# Patient Record
Sex: Male | Born: 1935 | Race: White | Hispanic: No | Marital: Single | Smoking: Former smoker
Health system: Southern US, Community
[De-identification: ages and names within clinical notes are randomized; demographics above are authoritative.]

## PROBLEM LIST (undated history)

## (undated) DIAGNOSIS — F068 Other specified mental disorders due to known physiological condition: Secondary | ICD-10-CM

## (undated) DIAGNOSIS — I2510 Atherosclerotic heart disease of native coronary artery without angina pectoris: Secondary | ICD-10-CM

## (undated) DIAGNOSIS — I447 Left bundle-branch block, unspecified: Secondary | ICD-10-CM

## (undated) DIAGNOSIS — I714 Abdominal aortic aneurysm, without rupture, unspecified: Secondary | ICD-10-CM

## (undated) DIAGNOSIS — Z9049 Acquired absence of other specified parts of digestive tract: Secondary | ICD-10-CM

## (undated) DIAGNOSIS — I509 Heart failure, unspecified: Secondary | ICD-10-CM

## (undated) DIAGNOSIS — I6200 Nontraumatic subdural hemorrhage, unspecified: Secondary | ICD-10-CM

## (undated) DIAGNOSIS — N400 Enlarged prostate without lower urinary tract symptoms: Secondary | ICD-10-CM

## (undated) DIAGNOSIS — I499 Cardiac arrhythmia, unspecified: Secondary | ICD-10-CM

## (undated) DIAGNOSIS — N189 Chronic kidney disease, unspecified: Secondary | ICD-10-CM

## (undated) DIAGNOSIS — R55 Syncope and collapse: Secondary | ICD-10-CM

## (undated) DIAGNOSIS — I639 Cerebral infarction, unspecified: Secondary | ICD-10-CM

## (undated) DIAGNOSIS — R413 Other amnesia: Secondary | ICD-10-CM

## (undated) DIAGNOSIS — I701 Atherosclerosis of renal artery: Secondary | ICD-10-CM

## (undated) DIAGNOSIS — F329 Major depressive disorder, single episode, unspecified: Secondary | ICD-10-CM

## (undated) DIAGNOSIS — F32A Depression, unspecified: Secondary | ICD-10-CM

## (undated) DIAGNOSIS — I10 Essential (primary) hypertension: Secondary | ICD-10-CM

## (undated) DIAGNOSIS — I6529 Occlusion and stenosis of unspecified carotid artery: Secondary | ICD-10-CM

## (undated) DIAGNOSIS — E785 Hyperlipidemia, unspecified: Secondary | ICD-10-CM

## (undated) HISTORY — DX: Chronic kidney disease, unspecified: N18.9

## (undated) HISTORY — DX: Other amnesia: R41.3

## (undated) HISTORY — DX: Cerebral infarction, unspecified: I63.9

## (undated) HISTORY — DX: Nontraumatic subdural hemorrhage, unspecified: I62.00

## (undated) HISTORY — DX: Acquired absence of other specified parts of digestive tract: Z90.49

## (undated) HISTORY — DX: Benign prostatic hyperplasia without lower urinary tract symptoms: N40.0

## (undated) HISTORY — PX: MASTOIDECTOMY: SHX711

## (undated) HISTORY — PX: INNER EAR SURGERY: SHX679

## (undated) HISTORY — DX: Major depressive disorder, single episode, unspecified: F32.9

## (undated) HISTORY — DX: Depression, unspecified: F32.A

## (undated) HISTORY — PX: CAROTID ENDARTERECTOMY: SUR193

## (undated) HISTORY — DX: Occlusion and stenosis of unspecified carotid artery: I65.29

## (undated) HISTORY — DX: Atherosclerosis of renal artery: I70.1

## (undated) HISTORY — PX: CARDIAC CATHETERIZATION: SHX172

## (undated) HISTORY — DX: Left bundle-branch block, unspecified: I44.7

## (undated) HISTORY — DX: Heart failure, unspecified: I50.9

## (undated) HISTORY — DX: Syncope and collapse: R55

## (undated) HISTORY — DX: Abdominal aortic aneurysm, without rupture, unspecified: I71.40

## (undated) HISTORY — DX: Other specified mental disorders due to known physiological condition: F06.8

## (undated) HISTORY — DX: Essential (primary) hypertension: I10

## (undated) HISTORY — DX: Atherosclerotic heart disease of native coronary artery without angina pectoris: I25.10

## (undated) HISTORY — DX: Hyperlipidemia, unspecified: E78.5

## (undated) HISTORY — DX: Cardiac arrhythmia, unspecified: I49.9

## (undated) HISTORY — DX: Abdominal aortic aneurysm, without rupture: I71.4

---

## 1998-06-14 ENCOUNTER — Other Ambulatory Visit: Admission: RE | Admit: 1998-06-14 | Discharge: 1998-06-14 | Payer: Self-pay | Admitting: Cardiology

## 1999-04-27 ENCOUNTER — Encounter: Payer: Self-pay | Admitting: Cardiology

## 1999-04-27 ENCOUNTER — Ambulatory Visit (HOSPITAL_COMMUNITY): Admission: RE | Admit: 1999-04-27 | Discharge: 1999-04-27 | Payer: Self-pay | Admitting: Cardiology

## 2000-06-05 ENCOUNTER — Ambulatory Visit (HOSPITAL_COMMUNITY): Admission: RE | Admit: 2000-06-05 | Discharge: 2000-06-05 | Payer: Self-pay | Admitting: Vascular Surgery

## 2000-06-05 ENCOUNTER — Encounter: Payer: Self-pay | Admitting: Vascular Surgery

## 2000-09-18 ENCOUNTER — Encounter: Payer: Self-pay | Admitting: Vascular Surgery

## 2000-09-20 ENCOUNTER — Inpatient Hospital Stay: Admission: RE | Admit: 2000-09-20 | Discharge: 2000-09-21 | Payer: Self-pay | Admitting: Vascular Surgery

## 2000-09-20 ENCOUNTER — Encounter (INDEPENDENT_AMBULATORY_CARE_PROVIDER_SITE_OTHER): Payer: Self-pay | Admitting: Specialist

## 2000-10-09 ENCOUNTER — Encounter: Payer: Self-pay | Admitting: Vascular Surgery

## 2000-10-09 ENCOUNTER — Encounter: Admission: RE | Admit: 2000-10-09 | Discharge: 2000-10-09 | Payer: Self-pay | Admitting: Vascular Surgery

## 2001-06-22 ENCOUNTER — Emergency Department (HOSPITAL_COMMUNITY): Admission: EM | Admit: 2001-06-22 | Discharge: 2001-06-22 | Payer: Self-pay | Admitting: Emergency Medicine

## 2001-06-22 ENCOUNTER — Encounter: Payer: Self-pay | Admitting: Cardiology

## 2004-04-26 ENCOUNTER — Emergency Department (HOSPITAL_COMMUNITY): Admission: EM | Admit: 2004-04-26 | Discharge: 2004-04-26 | Payer: Self-pay | Admitting: Hematology and Oncology

## 2005-06-05 ENCOUNTER — Encounter: Admission: RE | Admit: 2005-06-05 | Discharge: 2005-06-05 | Payer: Self-pay | Admitting: Cardiology

## 2005-08-16 ENCOUNTER — Inpatient Hospital Stay (HOSPITAL_COMMUNITY): Admission: RE | Admit: 2005-08-16 | Discharge: 2005-08-18 | Payer: Self-pay | Admitting: Vascular Surgery

## 2005-09-22 ENCOUNTER — Other Ambulatory Visit: Payer: Self-pay

## 2005-09-22 ENCOUNTER — Inpatient Hospital Stay: Payer: Self-pay | Admitting: Internal Medicine

## 2005-10-23 ENCOUNTER — Ambulatory Visit: Payer: Self-pay | Admitting: Gastroenterology

## 2006-01-22 ENCOUNTER — Ambulatory Visit: Payer: Self-pay | Admitting: Internal Medicine

## 2006-01-25 ENCOUNTER — Ambulatory Visit: Payer: Self-pay | Admitting: Internal Medicine

## 2006-04-09 ENCOUNTER — Ambulatory Visit: Payer: Self-pay | Admitting: Internal Medicine

## 2006-04-24 ENCOUNTER — Ambulatory Visit: Payer: Self-pay | Admitting: Internal Medicine

## 2007-04-24 ENCOUNTER — Inpatient Hospital Stay: Payer: Self-pay | Admitting: General Surgery

## 2007-05-06 ENCOUNTER — Ambulatory Visit: Payer: Self-pay | Admitting: General Surgery

## 2007-05-07 ENCOUNTER — Ambulatory Visit: Payer: Self-pay | Admitting: General Surgery

## 2007-05-20 ENCOUNTER — Ambulatory Visit: Payer: Self-pay | Admitting: Urology

## 2007-05-21 ENCOUNTER — Ambulatory Visit: Payer: Self-pay | Admitting: Gastroenterology

## 2007-07-05 ENCOUNTER — Ambulatory Visit: Payer: Self-pay | Admitting: Gastroenterology

## 2007-11-22 IMAGING — CT CT ABDOMEN AND PELVIS WITHOUT AND WITH CONTRAST
2 of 4 series · 12 of 32 positions shown, 17 images · non-contrast
Comparison: none

REASON FOR EXAM: Left Hydronephrosis
COMMENTS:

[Series 2: soft tissue · axial · 0.63mm/px · z∈[-647,-332]mm · 8 of 81 slices shown, 13 images]
[im 9/81  soft-tissue]
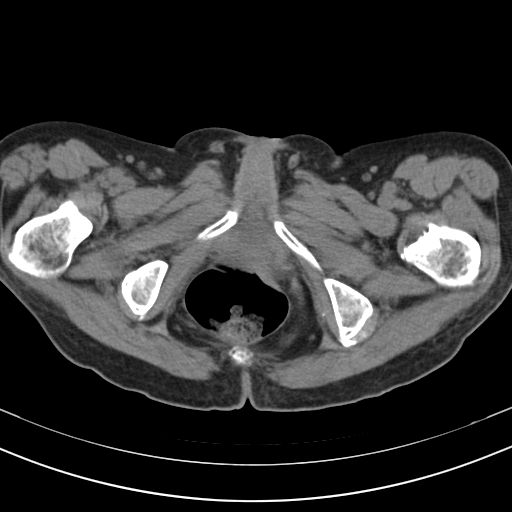
[im 9/81  bone]
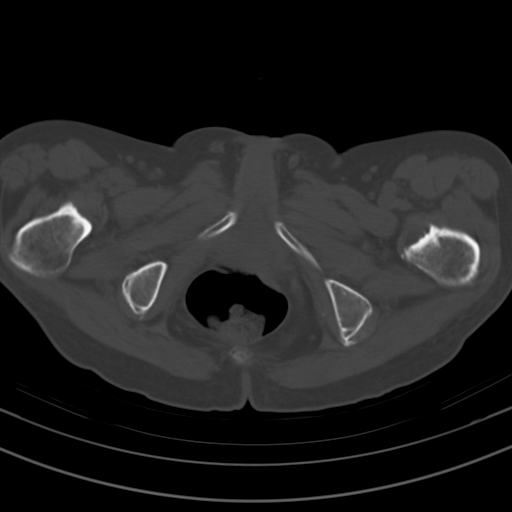
[im 18/81  soft-tissue]
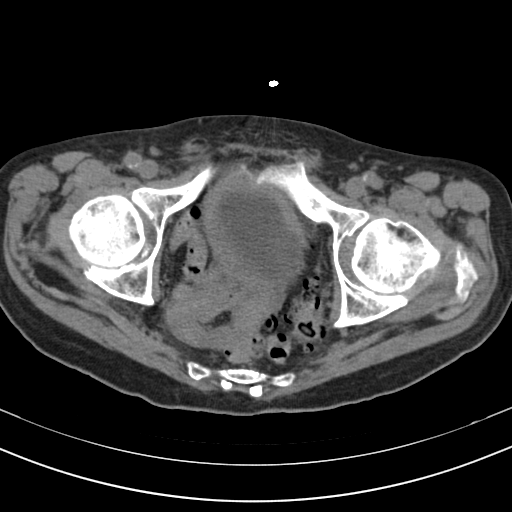
[im 27/81  soft-tissue]
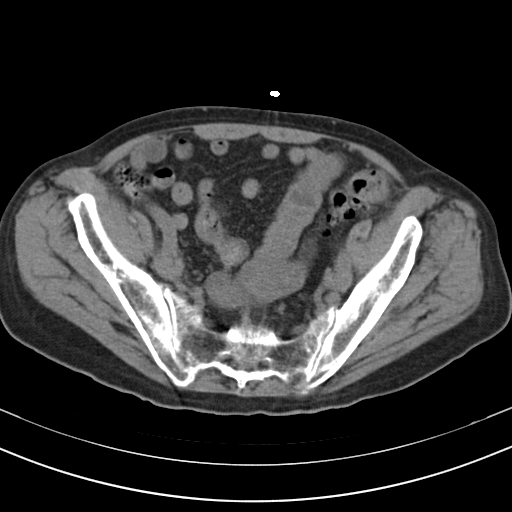
[im 36/81  soft-tissue]
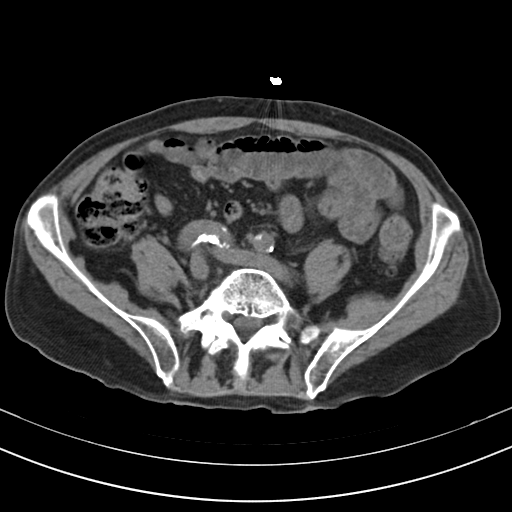
[im 45/81  soft-tissue]
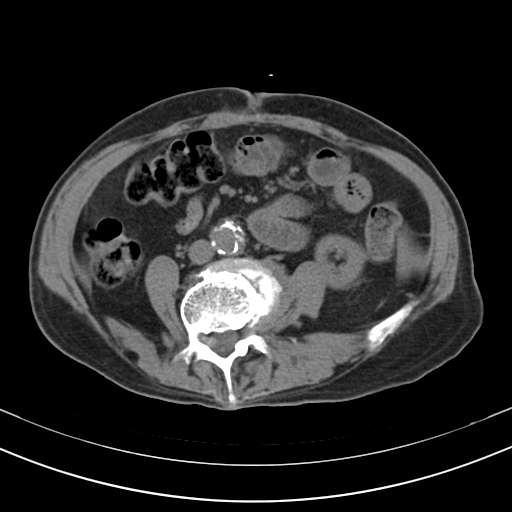
[im 45/81  lung]
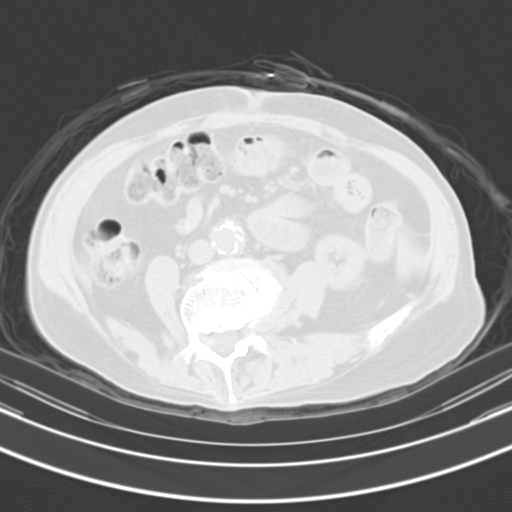
[im 54/81  soft-tissue]
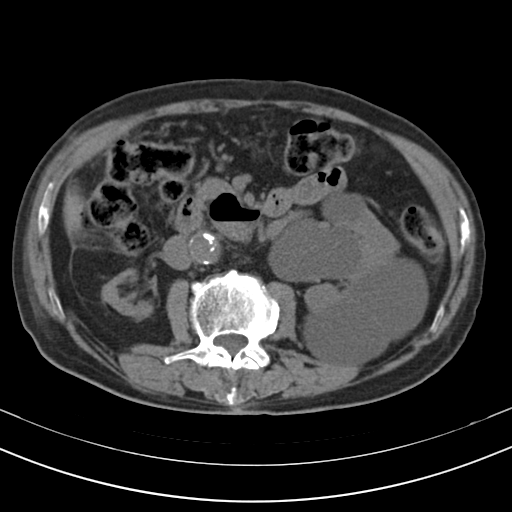
[im 54/81  lung]
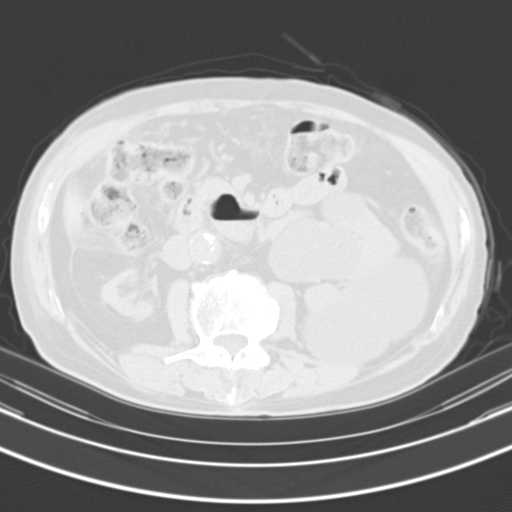
[im 63/81  soft-tissue]
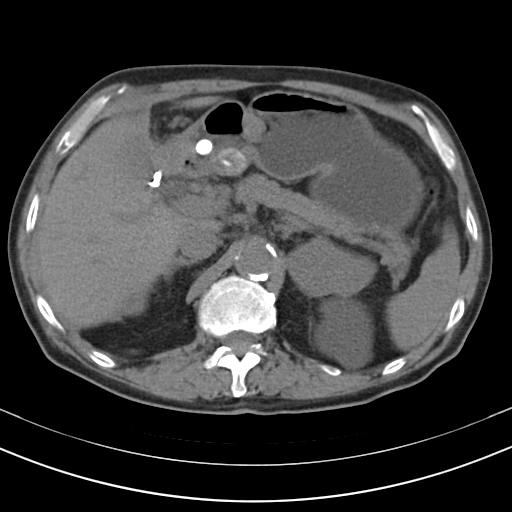
[im 63/81  lung]
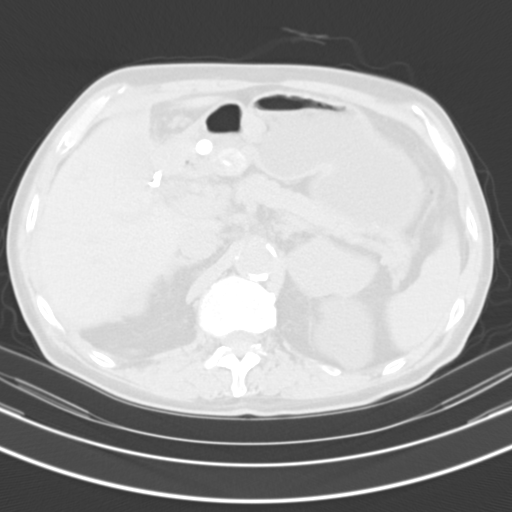
[im 72/81  soft-tissue]
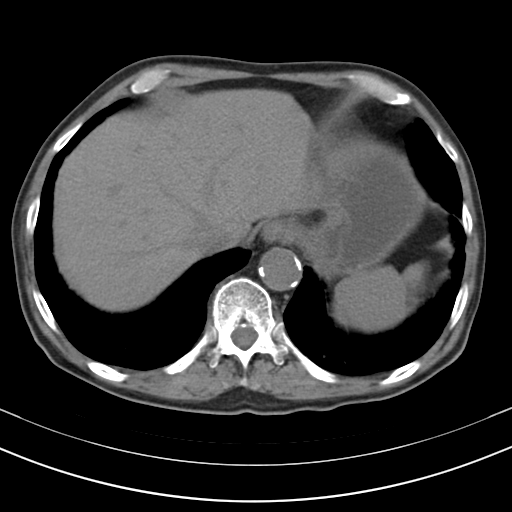
[im 72/81  lung]
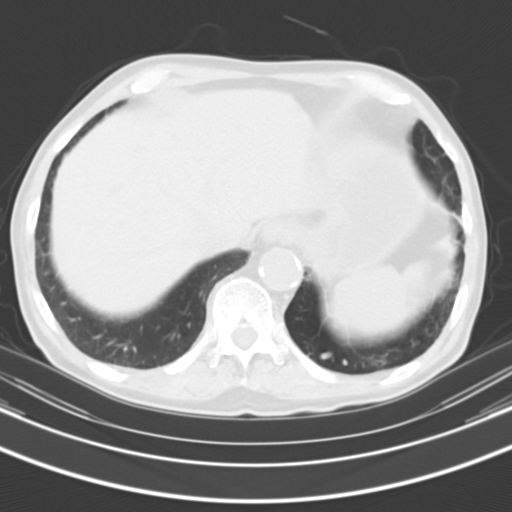

[Series 3: soft tissue with · axial · 0.63mm/px · z∈[-647,-512]mm · 4 of 81 slices shown]
[im 9/81  soft-tissue]
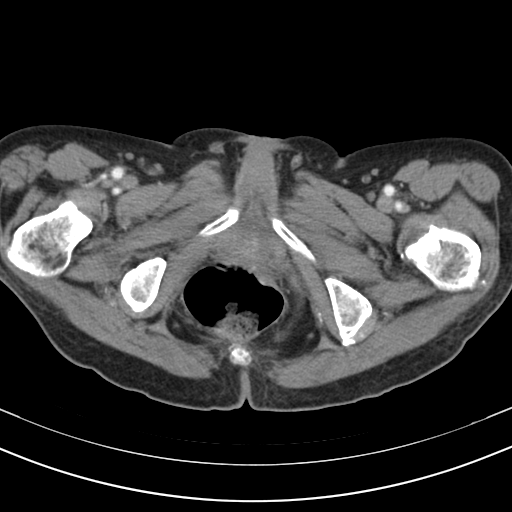
[im 18/81  soft-tissue]
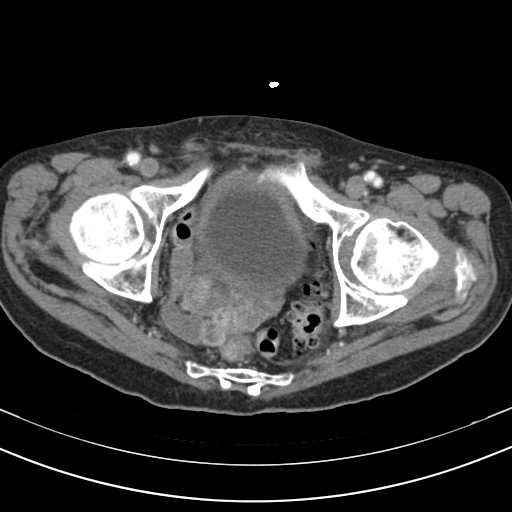
[im 27/81  soft-tissue]
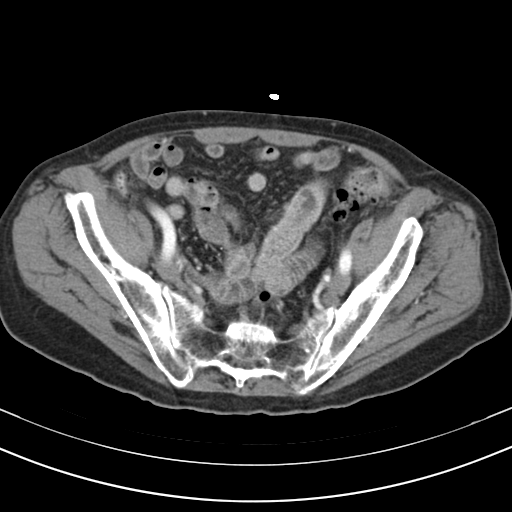
[im 36/81  soft-tissue]
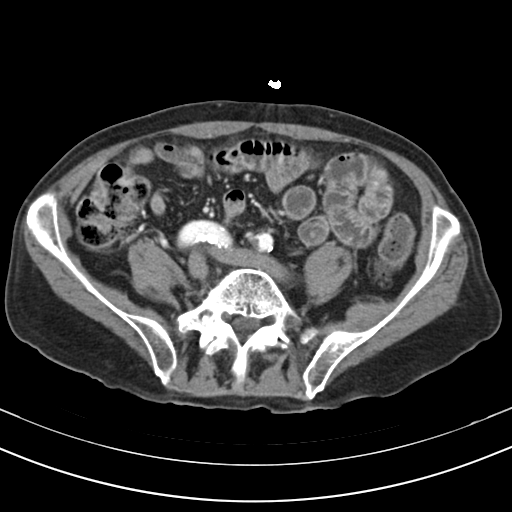

[12 of 32 positions shown; findings below may reference images not displayed]

PROCEDURE:     MEKIC - MEKIC ABDOMEN / PELVIS W/WO  - May 20, 2007 [DATE]

RESULT:     A triphasic scan was performed to allow evaluation of the
kidneys. On the noncontrast images, the RIGHT kidney is seen to be atrophic.
There is a hypodense focus in the midpole measuring approximately 1.0 cm in
diameter with Hounsfield measurements of 8 precontrast, 19 immediately post
contrast and approximately 6 on the delayed images. Only a tiny amount of
contrast is seen in the RIGHT kidney on the delayed images within the
collecting system.

On the LEFT there are dominant cortical and parapelvic cysts present. The
largest cyst appears to be either two closely applied cysts or a septated
cyst arising from the midpole. Hounsfield measurements are between -1 and +6
on all 3 phases of the study. The dominant parapelvic cyst has Hounsfield
measurements of approximately 8 on all 3 phases of the study. The parapelvic
cyst measures approximately  6.4 x 3.4 cm. The dominant midpole cyst
measures approximately 8.0 x 9.0 cm. A small anterior midpole cyst on the
LEFT measures approximately 2.6 x 2.8 cm. It has Hounsfield measurements
ranging from 3 precontrast, rising to 15 postcontrast, and falling to 7 on
delayed images. I do not see significant perinephric fluid collections. The
LEFT kidney enhances much more than that on the RIGHT.

The liver exhibits normal density. There is a trace of intrahepatic ductal
dilation. There are surgical clips in the gallbladder fossa. There is fluid
present in the gallbladder fossa measuring approximately 2.5 x 1.8 cm.
Increased density in the fat in the surrounding region is seen.

The stomach is distended with water. The pancreas, adrenal glands, and
spleen are normal in appearance. There is tortuosity the descending thoracic
aorta and there is curvature of the lumbar spine convex toward the RIGHT
with associated degenerative change. The partially distended urinary bladder
is normal in appearance. The partially distended loops of small and large
bowel exhibit no acute abnormality. There is a structure seen that likely
reflects a normal appendix containing some air.

Not mentioned above is an approximately 2.0 cm diameter subcapsular fluid
collection in the liver measuring approximately 19 Hounsfield units. It is
nonspecific in appearance. This may be reflective of the fluid collection
identified on prior ultrasound.

The lung bases exhibit some emphysematous changes and evidence of
bronchiectasis in the LEFT lower lobe.
IMPRESSION: 1. There is RIGHT renal atrophy. There are dominant parapelvic and cortical
cysts on the LEFT. I do not see urinary tract obstruction. There is
calcification of the wall of the abdominal aorta and there is some
calcification within the RIGHT renal artery proximally that may be the
etiology for the renal atrophy.
2. The patient has undergone prior cholecystectomy on the RIGHT. There is
ill-defined increased density in the RIGHT upper quadrant adjacent to the
region of the gallbladder fossa and a small amount adjacent to the inferior
aspect of the liver. Correlation with any symptoms in this regard is needed.
Was the patient's surgery recent?  Are there findings that might indicate
low level inflammation clinically?
3. There are degenerative changes of the lumbar spine with scoliosis convex
to the RIGHT. There is also evidence of bronchiectasis in the LEFT lower
lobe.
4. On delayed images, the appearance of the urinary bladder is within the
limits of normal. The LEFT ureter does not appear distended. There may be a
trace of free fluid in the pelvis but it is  difficult to separate loops of
unopacified small bowel from the adjacent soft tissues.

## 2007-11-23 IMAGING — RF DG UGI W/ KUB
1 series · 14 of 23 positions shown · non-contrast
Comparison: none

REASON FOR EXAM: epigastric pain
COMMENTS:

[Series 1: run · 14 of 23 slices shown]
[im 1/23]
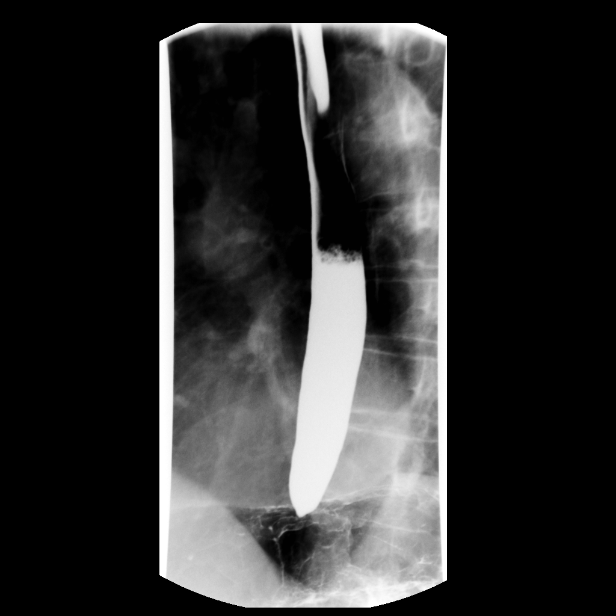
[im 3/23]
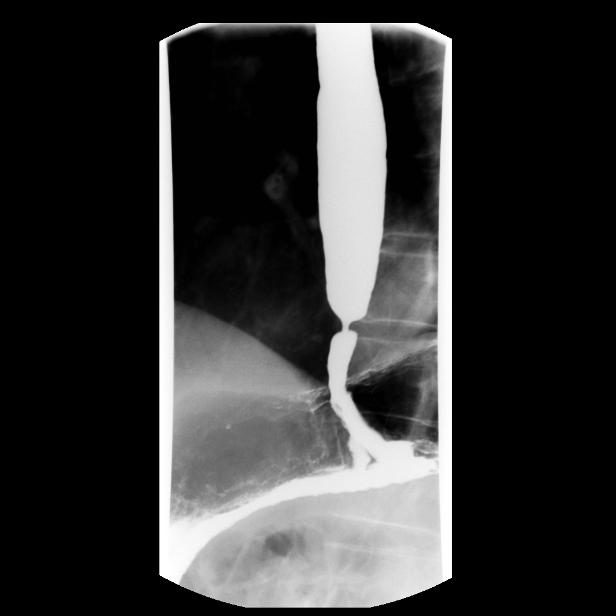
[im 5/23]
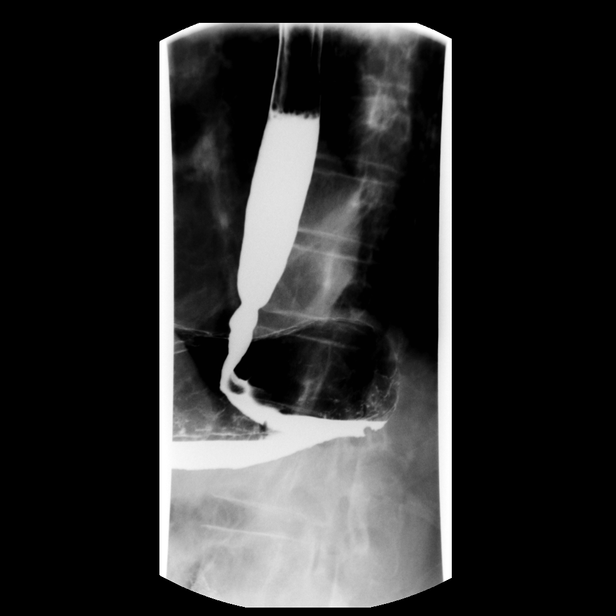
[im 6/23]
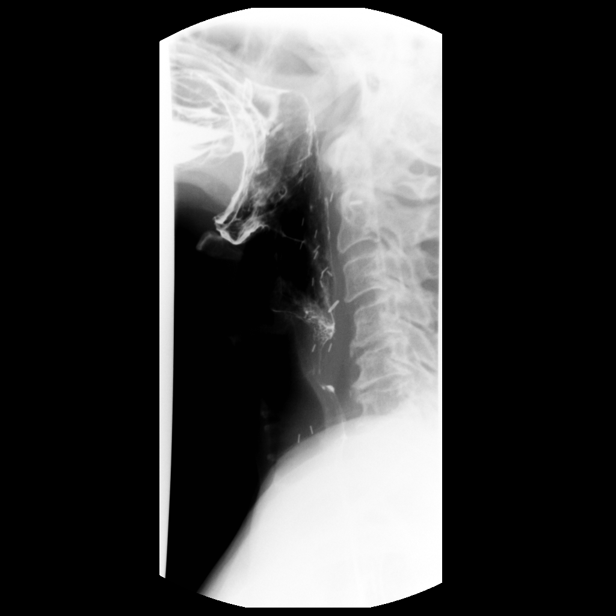
[im 8/23]
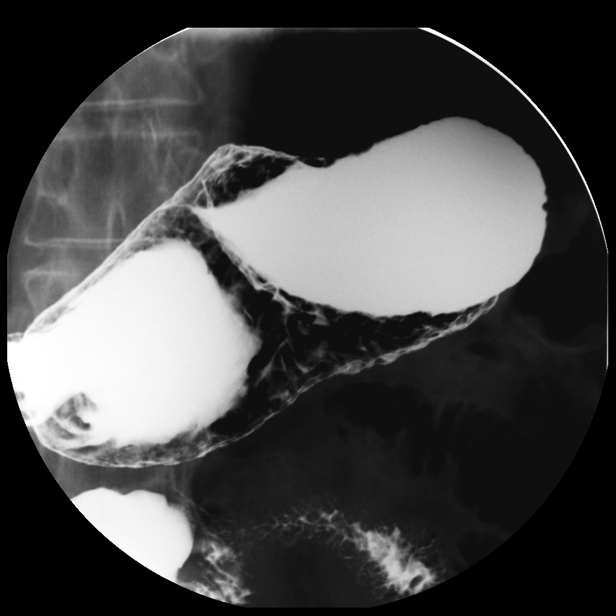
[im 10/23]
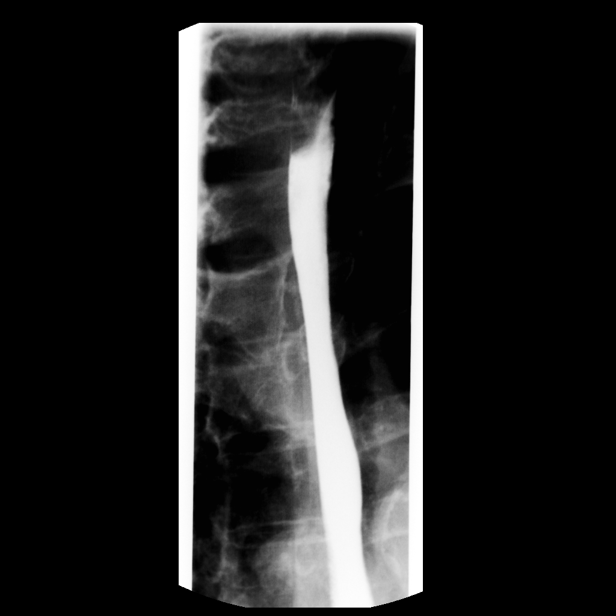
[im 11/23]
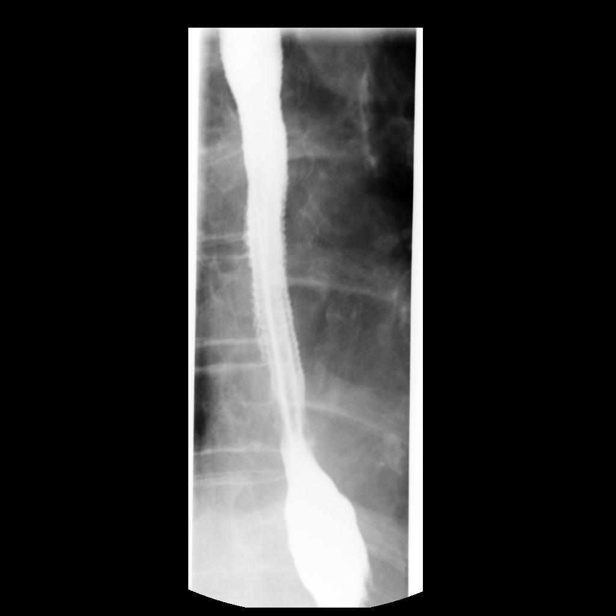
[im 13/23]
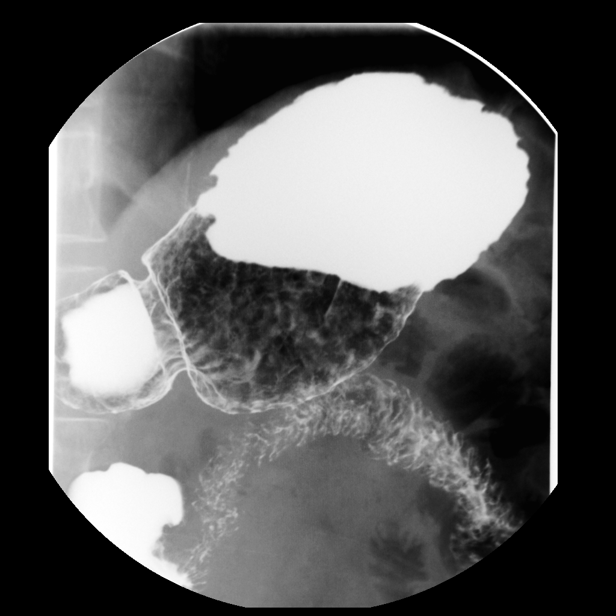
[im 14/23]
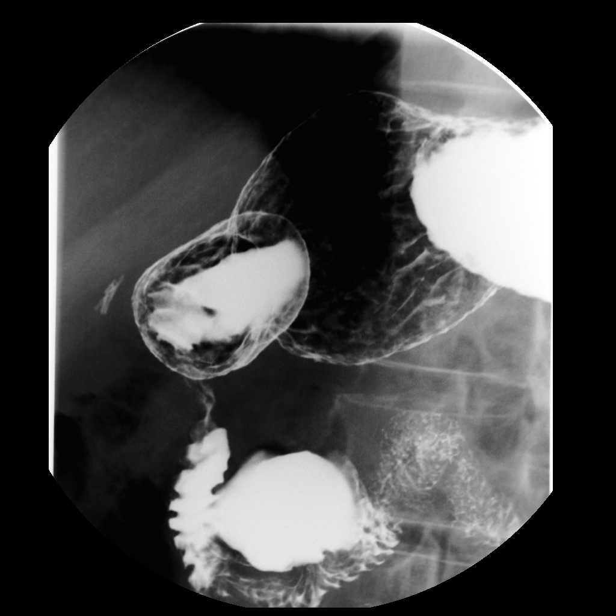
[im 16/23]
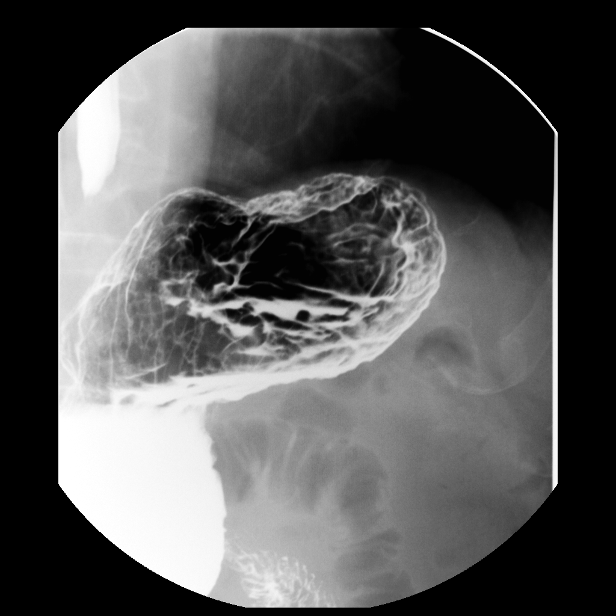
[im 18/23]
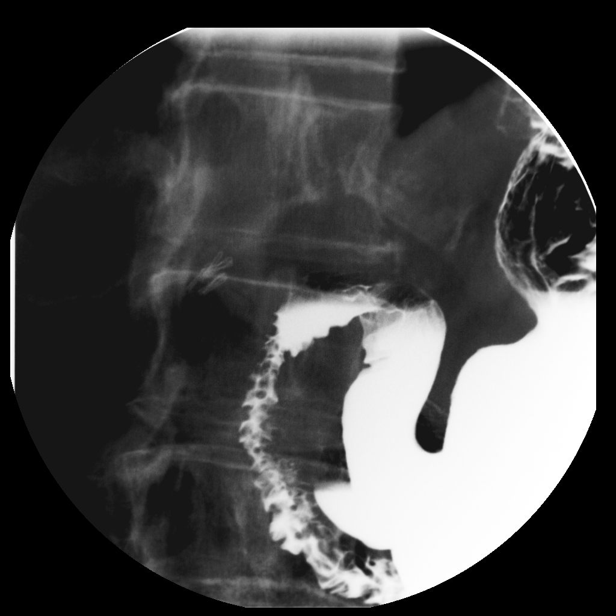
[im 19/23]
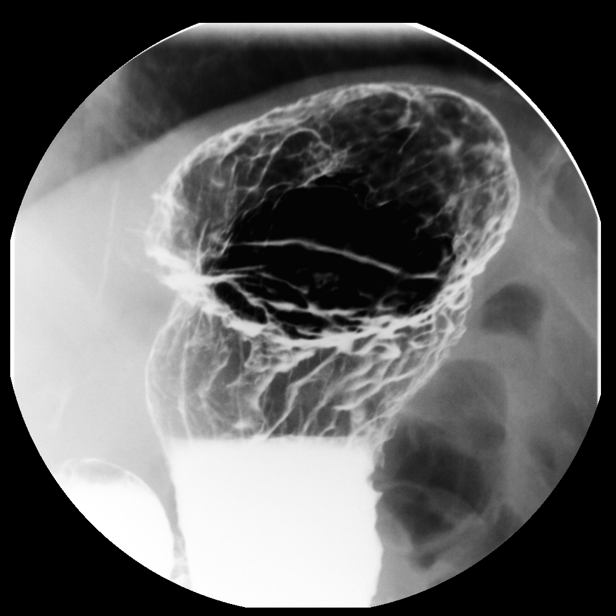
[im 21/23]
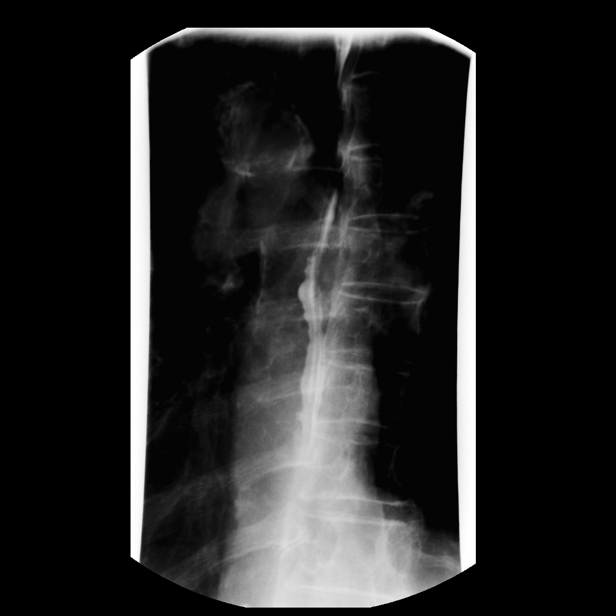
[im 23/23]
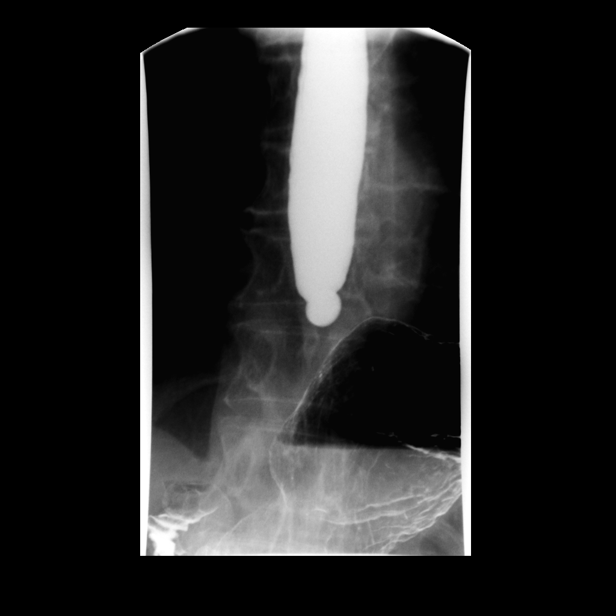

[14 of 23 positions shown; findings below may reference images not displayed]

PROCEDURE:     FL  - FL UPPER GI W/ BARIUM SWALLOW  - May 21, 2007 [DATE]

RESULT:     The anticipated procedure was discussed with Mr. Billiot and
with his daughter. He voiced his willingness to proceed.

The cervical esophagus distended well. There was no laryngeal penetration of
the barium. The thoracic esophagus exhibited some change of presbyesophagus
especially in the prone position. There is an area of transient narrowing in
the distal esophagus. This, however, did not distend enough to allow passage
of the 12 mm barium pill. It was not obstructive to the liquid barium,
however. When the patient turned to the prone position there was marked
gastroesophageal reflux to the level of the thoracic inlet. The stomach
distended well. Gastric emptying appeared normal. The duodenal bulb and
C-sweep are normal in appearance. No ulceration or mass was identified
within the stomach or duodenum.
IMPRESSION: 1. There are mild changes of presbyesophagus. There is marked
gastroesophageal reflux which occurs with changes in patient positioning.
There is also narrowing of the distal esophagus that would not allow passage
of the 12 mm barium pill but which was not obstructive to barium. Direct
visualization would be of value.
2. I do not see evidence of gastric or duodenal ulceration.

## 2008-01-17 ENCOUNTER — Ambulatory Visit: Payer: Self-pay | Admitting: Ophthalmology

## 2008-01-17 ENCOUNTER — Other Ambulatory Visit: Payer: Self-pay

## 2008-01-21 ENCOUNTER — Ambulatory Visit: Payer: Self-pay | Admitting: Ophthalmology

## 2008-02-25 ENCOUNTER — Ambulatory Visit: Payer: Self-pay | Admitting: Ophthalmology

## 2009-02-18 ENCOUNTER — Ambulatory Visit: Payer: Self-pay | Admitting: Internal Medicine

## 2009-02-18 ENCOUNTER — Inpatient Hospital Stay: Payer: Self-pay | Admitting: Internal Medicine

## 2009-02-22 ENCOUNTER — Ambulatory Visit: Payer: Self-pay | Admitting: Internal Medicine

## 2009-03-25 ENCOUNTER — Ambulatory Visit: Payer: Self-pay | Admitting: Internal Medicine

## 2009-10-19 ENCOUNTER — Ambulatory Visit: Payer: Self-pay | Admitting: Vascular Surgery

## 2010-05-27 ENCOUNTER — Ambulatory Visit: Payer: Self-pay | Admitting: Orthopedic Surgery

## 2010-06-02 ENCOUNTER — Ambulatory Visit: Payer: Self-pay | Admitting: Orthopedic Surgery

## 2010-08-26 ENCOUNTER — Inpatient Hospital Stay: Payer: Self-pay | Admitting: Internal Medicine

## 2010-09-01 ENCOUNTER — Encounter: Payer: Self-pay | Admitting: Internal Medicine

## 2010-09-05 ENCOUNTER — Ambulatory Visit: Payer: Self-pay | Admitting: Cardiology

## 2010-09-20 ENCOUNTER — Ambulatory Visit: Payer: Self-pay | Admitting: Nephrology

## 2010-09-24 ENCOUNTER — Encounter: Payer: Self-pay | Admitting: Internal Medicine

## 2010-09-27 ENCOUNTER — Ambulatory Visit: Payer: Self-pay | Admitting: Cardiology

## 2011-01-05 ENCOUNTER — Ambulatory Visit: Payer: Self-pay | Admitting: Cardiology

## 2011-03-25 IMAGING — CT CT ABD-PELV W/O CM
1 of 2 series · 15 of 32 positions shown, 19 images · non-contrast
Comparison: none

REASON FOR EXAM: RT kidney small    LT kidney has fluid break up
COMMENTS:

PROCEDURE:     EFLAL - EFLAL ABDOMEN AND PELVIS WO  - September 20, 2010 [DATE]
RESULT:
TECHNIQUE: Noncontrast CT of the abdomen and pelvis is reconstructed at 3 mm
slice thickness in the axial plane and compared to the previous triple phase
CT dated 05/20/2007.

[Series 3: soft tissue · axial · 0.71mm/px · z∈[-218,+172]mm · 15 of 142 slices shown, 19 images]
[im 6/142  soft-tissue]
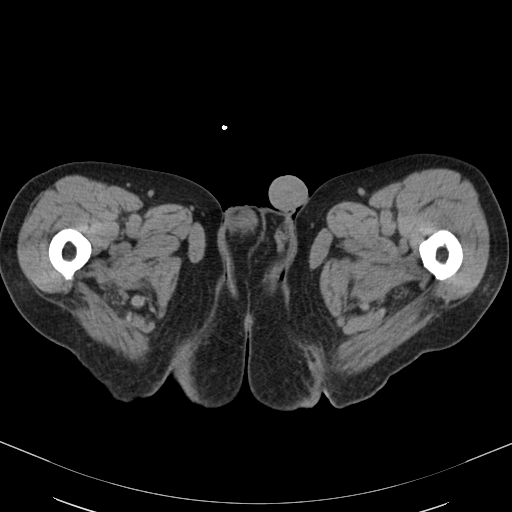
[im 6/142  bone]
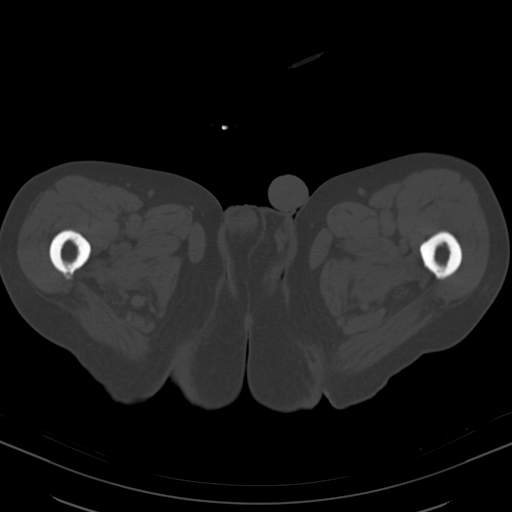
[im 17/142  soft-tissue]
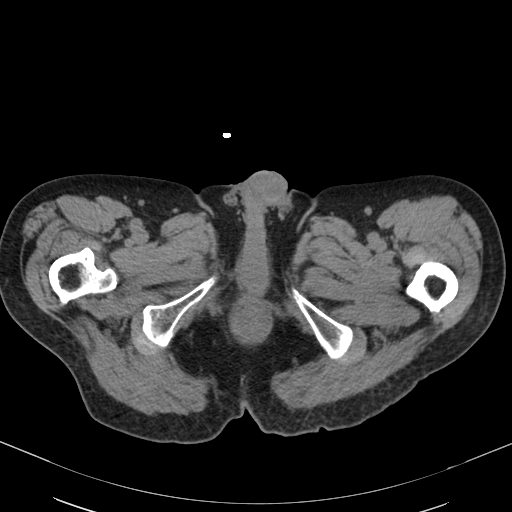
[im 29/142  soft-tissue]
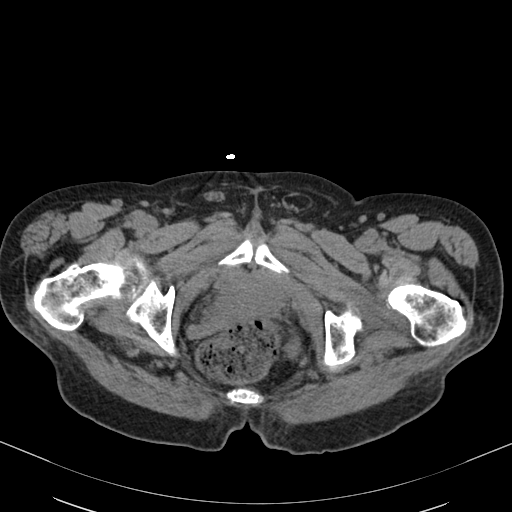
[im 40/142  soft-tissue]
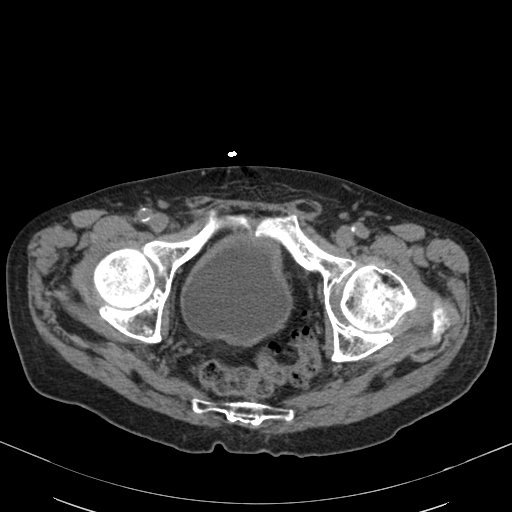
[im 51/142  soft-tissue]
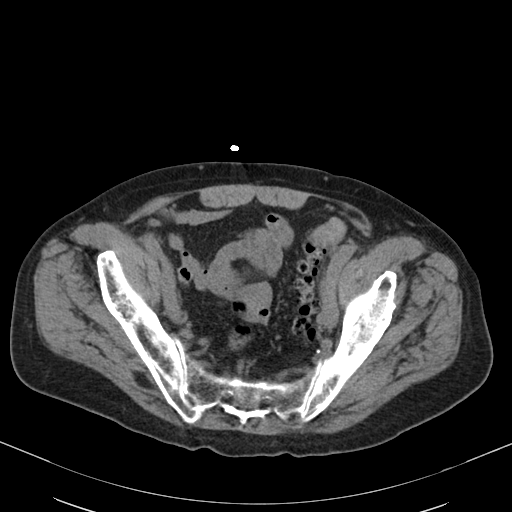
[im 63/142  soft-tissue]
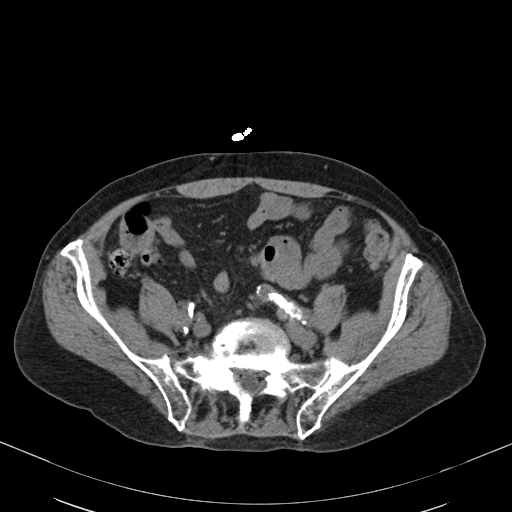
[im 74/142  soft-tissue]
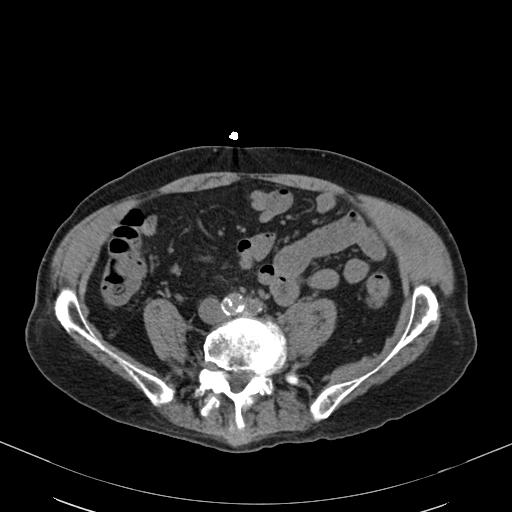
[im 79/142  soft-tissue]
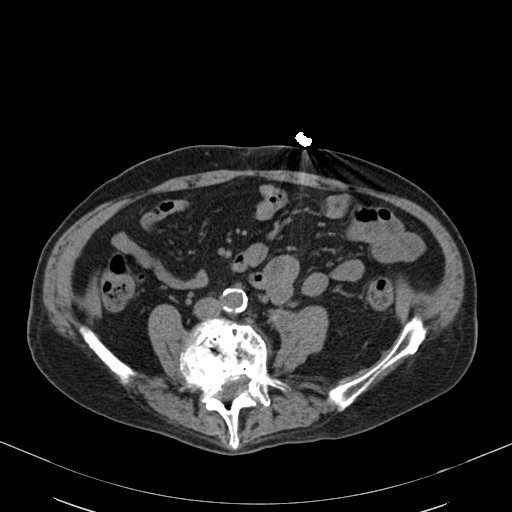
[im 91/142  soft-tissue]
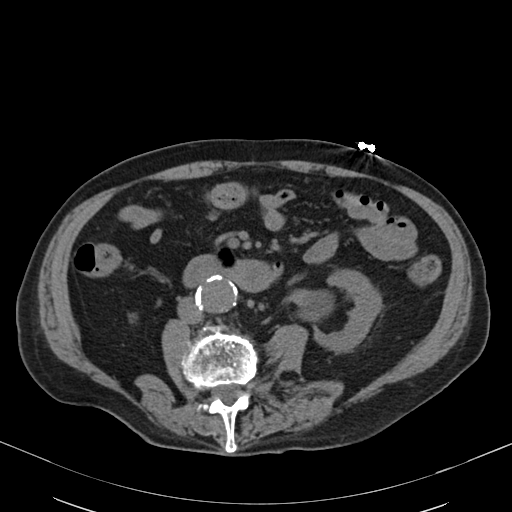
[im 91/142  bone]
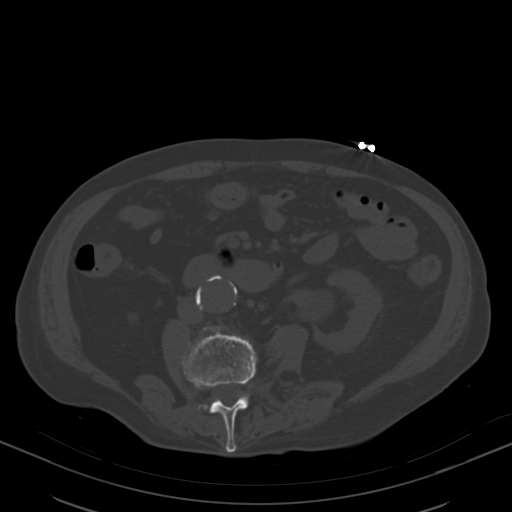
[im 102/142  soft-tissue]
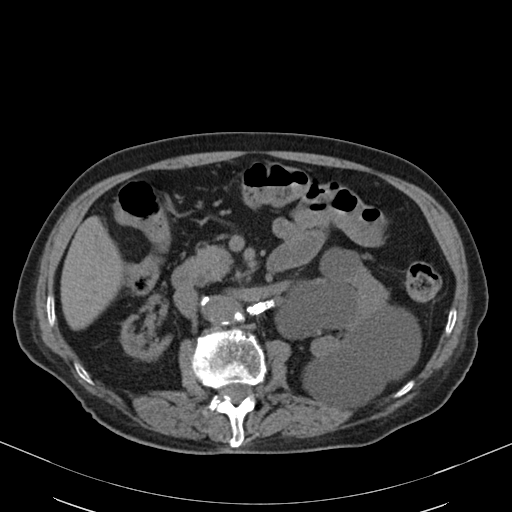
[im 113/142  soft-tissue]
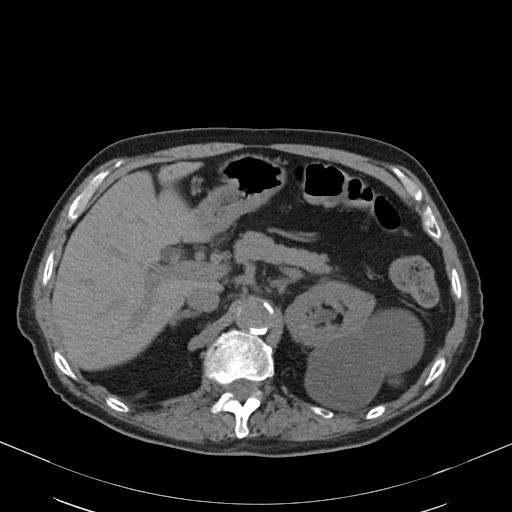
[im 119/142  lung]
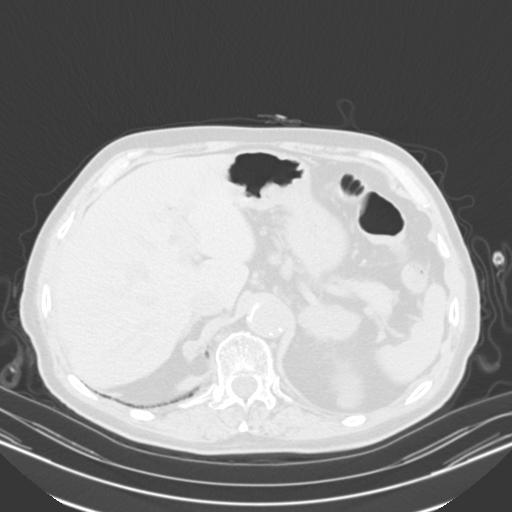
[im 125/142  soft-tissue]
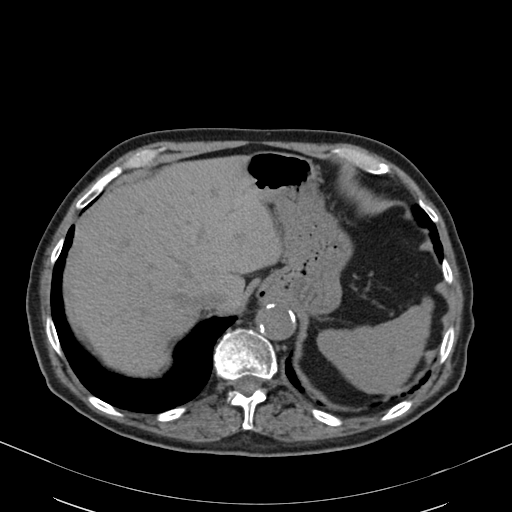
[im 125/142  lung]
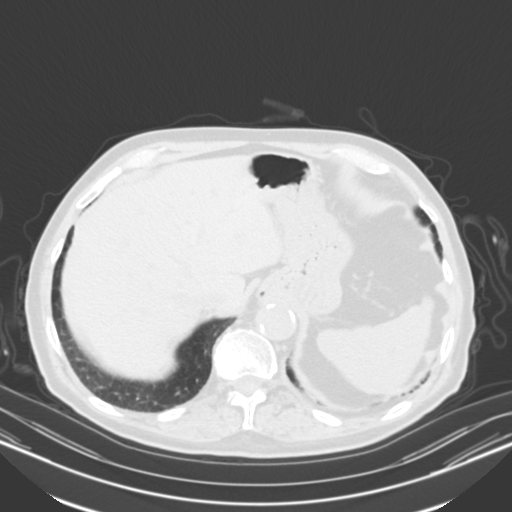
[im 130/142  lung]
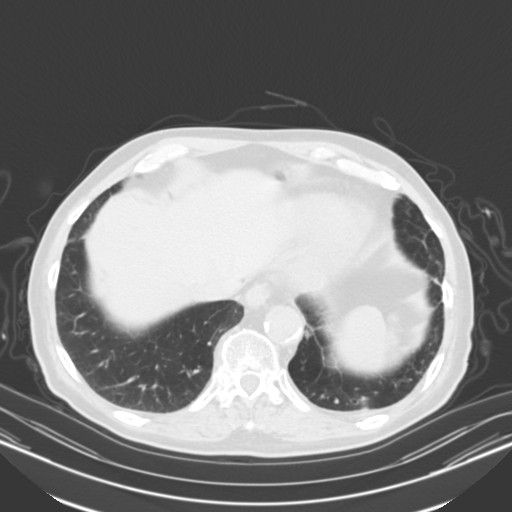
[im 136/142  soft-tissue]
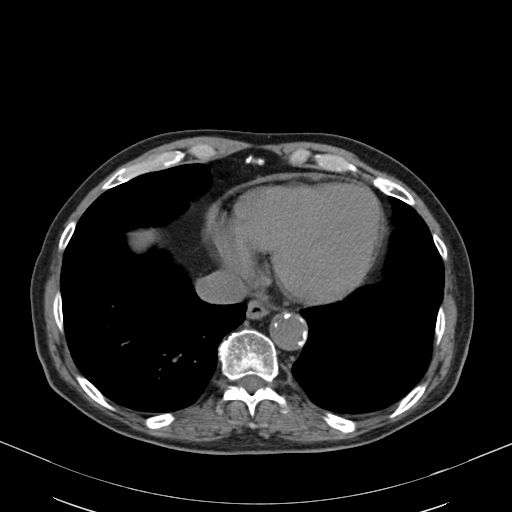
[im 136/142  lung]
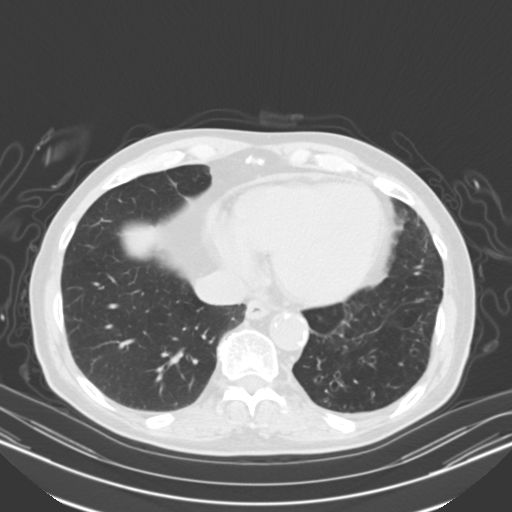

[15 of 32 positions shown; findings below may reference images not displayed]

FINDINGS: The renal atrophy on the right has progressed since the previous
study. There are numerous cystic areas in the parapelvic and cortical
regions of the left kidney which are large and appear stable. The bladder
wall is thickened. Correlate for chronic cystitis or neuromuscular changes
from spasm. The prostate is enlarged. There is colonic diverticulosis
without evidence of acute diverticulitis. Atherosclerotic disease is
present. There is a left renal artery stent present. Cholecystectomy clips
are demonstrated without abnormal density in the subhepatic region. There is
no acute inflammation, ascites or abnormal fluid collection. The liver,
pancreas and spleen as well as the adrenal glands appear within normal
limits for a noncontrast exam. The abdominal wall appears intact.
Degenerative changes are present in the hips and sacroiliac joints as well
as in the pelvis and spine. There is a scoliotic curvature, concave to the
left, centered at L3-4 with severe disc space narrowing and degenerative
end-plate changes.
IMPRESSION: 1.  Thickened wall of the urinary bladder which could be secondary to
chronic inflammation.
2.  Progressive atrophy of the right kidney which is likely not contributing
significant function.
3.  Large cysts in the cortical and parapelvic regions of the left kidney
which appear to be relatively stable with some smaller cysts present as well.
4.  Atherosclerotic disease of the aorta and its branches. Left renal artery
stent is present.
5.  Cholecystectomy changes. No abnormal fluid collection or inflammation
evident otherwise.

## 2011-03-29 ENCOUNTER — Encounter: Payer: Self-pay | Admitting: *Deleted

## 2011-03-30 ENCOUNTER — Ambulatory Visit (INDEPENDENT_AMBULATORY_CARE_PROVIDER_SITE_OTHER): Payer: Medicare Other | Admitting: Cardiology

## 2011-03-30 ENCOUNTER — Encounter: Payer: Self-pay | Admitting: Cardiology

## 2011-03-30 DIAGNOSIS — I6200 Nontraumatic subdural hemorrhage, unspecified: Secondary | ICD-10-CM

## 2011-03-30 DIAGNOSIS — R55 Syncope and collapse: Secondary | ICD-10-CM

## 2011-03-30 DIAGNOSIS — I2510 Atherosclerotic heart disease of native coronary artery without angina pectoris: Secondary | ICD-10-CM

## 2011-03-30 DIAGNOSIS — S065X9A Traumatic subdural hemorrhage with loss of consciousness of unspecified duration, initial encounter: Secondary | ICD-10-CM

## 2011-03-30 DIAGNOSIS — N289 Disorder of kidney and ureter, unspecified: Secondary | ICD-10-CM | POA: Insufficient documentation

## 2011-03-30 DIAGNOSIS — I679 Cerebrovascular disease, unspecified: Secondary | ICD-10-CM

## 2011-03-30 NOTE — Assessment & Plan Note (Addendum)
He had a myocardial infarction in 1987. His last catheterization was in 1996 which showed normal left ventricular function with three-vessel coronary atherosclerosis involving a 90% right coronary artery, 90% proximal obtuse marginal, and 50-60% mid LAD he an ejection fraction of 45-50% in September 2011 by echocardiography.  He has remained stable from a cardiac standpoint but he is getting progressively frail. I will have him see Lawson Fiscal in 4 months and then referred to Dr. Dossie Arbour

## 2011-03-30 NOTE — Assessment & Plan Note (Signed)
I encouraged family to keep watch on Mr. [name]. In one week we'll repeat a CT scan to make sure he's not had worsening of his subdural hematoma.

## 2011-03-30 NOTE — Assessment & Plan Note (Signed)
Renal function in the hospital showed creatinine 1.2.

## 2011-03-30 NOTE — Progress Notes (Signed)
Subjective:   [name] comes in today for a followup visit. He got up in his home and took a walk to another room and had syncope. He fell and hit his head with loss of consciousness for brief period of time. He was taken to [facility] in [place] where he had a subdural hematoma. He has done well since discharge on March 19, 2011. He has occasional headaches but has not had any change in mental status or balance. The cause of the syncope was felt to be multifactorial. He has not had recurrence.  Current Outpatient Prescriptions  Medication Sig Dispense Refill  . atenolol (TENORMIN) 50 MG tablet Take 25 mg by mouth daily.       . cloNIDine (CATAPRES - DOSED IN MG/24 HR) 0.1 mg/24hr patch Place 1 patch onto the skin once a week.        . docusate sodium (COLACE) 100 MG capsule Take 100 mg by mouth daily.       Marland Kitchen dutasteride (AVODART) 0.5 MG capsule Take 0.5 mg by mouth daily.        Marland Kitchen escitalopram (LEXAPRO) 10 MG tablet Take 10 mg by mouth daily.        Marland Kitchen esomeprazole (NEXIUM) 40 MG capsule Take 40 mg by mouth daily before breakfast.        . furosemide (LASIX) 40 MG tablet Take 40 mg by mouth as needed.        . isosorbide mononitrate (IMDUR) 60 MG 24 hr tablet Take 60 mg by mouth daily.        . magnesium oxide (MAG-OX) 400 MG tablet Take 400 mg by mouth daily.        Marland Kitchen NIFEdipine (PROCARDIA XL/ADALAT-CC) 60 MG 24 hr tablet Take 60 mg by mouth daily.        . pravastatin (PRAVACHOL) 20 MG tablet Take 20 mg by mouth daily.        . Tamsulosin HCl (FLOMAX) 0.4 MG CAPS Take 0.4 mg by mouth daily.        . Ascorbic Acid (VITAMIN C) 500 MG tablet Take 1 tablet (500 mg total) by mouth daily.  30 tablet    . Calcium Carbonate-Vitamin D (CALCIUM-VITAMIN D) 500-200 MG-UNIT per tablet Take 1 tablet by mouth daily.  30 tablet    . Multiple Vitamins-Minerals (MULTIVITAMIN) tablet Take 1 tablet by mouth daily.  30 tablet    . DISCONTD: dipyridamole-aspirin (AGGRENOX) 25-200 MG per 12 hr  capsule Take 1 capsule by mouth 2 (two) times daily.  60 capsule      Allergies  Allergen Reactions  . Doxycycline     There is no problem list on file for this patient.   History  Smoking status  . Former Smoker  . Quit date: 12/25/1948  Smokeless tobacco  . Not on file    History  Alcohol Use No    Family History  Problem Relation Age of Onset  . Hypertension Mother   . Hypertension Father     Review of Systems:   The patient denies any heat or cold intolerance.  No weight gain or weight loss.  The patient denies headaches or blurry vision.  There is no cough or sputum production.  The patient denies dizziness.  There is no hematuria or hematochezia.  The patient denies any muscle aches or arthritis.  The patient denies any rash.  The patient denies frequent falling or instability.  There is no history of depression or anxiety.  All other systems were reviewed and are negative.   Physical Exam:   Vital signs are reviewed. He's a pleasant elderly male in no acute distress. He is quiet but appropriate. He is a grade 2/6 systolic outflow murmur. Lungs are clear.The head is normocephalic and atraumatic.  Pupils are equally round and reactive to light.  Sclerae nonicteric.  Conjunctiva is clear.  Oropharynx is unremarkable.  There's adequate oral airway.  Neck is supple there are no masses.  Thyroid is not enlarged.  There is no lymphadenopathy.  Lungs are clear.  Chest is symmetric.  Heart shows a regular rate and rhythm.  S1 and S2 are normal.  There is no murmur click or gallop.  Abdomen is soft normal bowel sounds.  There is no organomegaly.  Genital and rectal deferred.  Extremities 2+ edema.  Peripheral pulses are adequateon the right but diminished on the left.  Neurologically intact.  Full range of motion.  The patient is not depressed.  Skin is warm and dry.  Assessment / Plan:

## 2011-03-30 NOTE — Assessment & Plan Note (Addendum)
I'm sure his syncope is multifactorial. I encouraged hydration and care in getting up quickly. If he has recurrent syncope, we will need to order his medicines although there are no real easy choices at this point in time. He does have BPH and is on alpha blockers to he has extensive vascular disease. He is somewhat frail but otherwise does well. He has an occluded right carotid artery and has had watershed events related to hypertension.

## 2011-04-05 ENCOUNTER — Other Ambulatory Visit: Payer: Self-pay | Admitting: Cardiology

## 2011-04-05 DIAGNOSIS — S065X9A Traumatic subdural hemorrhage with loss of consciousness of unspecified duration, initial encounter: Secondary | ICD-10-CM

## 2011-04-05 DIAGNOSIS — S065XAA Traumatic subdural hemorrhage with loss of consciousness status unknown, initial encounter: Secondary | ICD-10-CM

## 2011-04-10 ENCOUNTER — Other Ambulatory Visit: Payer: Self-pay | Admitting: Cardiology

## 2011-04-10 ENCOUNTER — Ambulatory Visit
Admission: RE | Admit: 2011-04-10 | Discharge: 2011-04-10 | Disposition: A | Discharge status: Home / Self Care | Payer: Medicare Other | Source: Ambulatory Visit | Attending: Cardiology | Admitting: Cardiology

## 2011-04-10 DIAGNOSIS — S065X9A Traumatic subdural hemorrhage with loss of consciousness of unspecified duration, initial encounter: Secondary | ICD-10-CM

## 2011-04-11 ENCOUNTER — Telehealth: Payer: Self-pay | Admitting: *Deleted

## 2011-04-11 NOTE — Telephone Encounter (Signed)
Message copied by Barnetta Hammersmith on Tue Apr 11, 2011  4:50 PM ------      Message from: Roger Shelter      Created: Tue Apr 11, 2011  4:23 PM       OK to report.

## 2011-04-11 NOTE — Telephone Encounter (Signed)
RN received a call from [facility] with [facility] regarding pt's BP 178/60, 200/79, 190/60 and 1-2 + pitting edema.  Pt is currently taking Atenolol 25 mg daily, Adalat 60 mg daily, and Imdur 60 mg.  Weight only 1-2 lbs up.  Pt is only taking Lasix 40 mg PRN for weight gain.  Norma Fredrickson NP notified and instructed RN to have pt take Lasix 40 mg today and PRN for weight gain.  Steward Drone (pt's daughter) notified of Lori's instructions and will back if BP/ pitting edema does not improve.

## 2011-04-11 NOTE — Telephone Encounter (Signed)
Steward Drone notified of CT results.

## 2011-04-13 ENCOUNTER — Telehealth: Payer: Self-pay | Admitting: *Deleted

## 2011-04-13 NOTE — Telephone Encounter (Signed)
Sherry with [facility] called, regarding pt's BP continuing to stay in the 180-200/90's.  Dr. Deborah Chalk notified and instructed RN to have pt take Lasix 40 mg daily and increase Adalat to 90 mg daily.  Steward Drone (pt's daughter) notified of Dr. Ronnald Nian instructions and will monitor pt's BP closely and call if BP drops below 150 systolic.

## 2011-04-17 ENCOUNTER — Telehealth: Payer: Self-pay | Admitting: Nurse Practitioner

## 2011-04-17 NOTE — Telephone Encounter (Signed)
Steward Drone called regarding her father. She has cut his Adalat back because his blood pressure has come down to 115 systolic. Today he is not able to stand upright. He will fall backwards if he tries to stand up. He is more confused. I have advised her to take him to the ER for a repeat CT scan of the head.

## 2011-05-01 ENCOUNTER — Other Ambulatory Visit: Payer: Self-pay | Admitting: *Deleted

## 2011-05-01 DIAGNOSIS — I259 Chronic ischemic heart disease, unspecified: Secondary | ICD-10-CM

## 2011-05-01 MED ORDER — ISOSORBIDE MONONITRATE ER 60 MG PO TB24: 60.0000 mg | ORAL_TABLET | Freq: Every day | ORAL | Status: DC

## 2011-05-02 ENCOUNTER — Telehealth: Payer: Self-pay | Admitting: Cardiology

## 2011-05-02 NOTE — Telephone Encounter (Signed)
Spoke to daughter Steward Drone concerning pts elevated BP in am and the drop after lunch. Reviewed meds with Steward Drone and Lawson Fiscal and no change in meds to be made at this time.  Also inquiring about repeat head CT. Spoke to [place] reguarding this and no repeat head CT unless pts condition/symptoms change. Daughter Soyla Dryer understanding.

## 2011-05-02 NOTE — Telephone Encounter (Signed)
Wanted to let Dr. Deborah Chalk know that his blood pressure was 200/92 this morning. The blood pressure will drop after he takes his lunch medication, usually down to 132 or 136/70. Blood pressure usually runs high in the morning and wanted to know if there was anything they could possibly change in the morning routine or night medication to drop the morning blood pressure. Please call back. I have pulled the chart.

## 2011-05-09 NOTE — Procedures (Signed)
CAROTID DUPLEX EXAM   INDICATION:  Bruit.   HISTORY:  Diabetes:  no  Cardiac:  Yes, angioplasty  Hypertension:  yes  Smoking:  no  Previous Surgery:  Redo of left carotid endarterectomy 2001.  CV History:  Currently asymptomatic.  Amaurosis Fugax No, Paresthesias No, Hemiparesis No                                       RIGHT             LEFT  Brachial systolic pressure:         168               166  Brachial Doppler waveforms:         normal            normal  Vertebral direction of flow:        No flow visualized                  antegrade  DUPLEX VELOCITIES (cm/sec)  CCA peak systolic                   Occluded distally 66  ECA peak systolic                   Occluded proximally                 74  ICA peak systolic                   occluded          68  ICA end diastolic                                     22  PLAQUE MORPHOLOGY:  PLAQUE AMOUNT:                      occlusive         None  PLAQUE LOCATION:   IMPRESSION:  1. Known occlusion of the right carotid arteries, as described above.  2. Patent left carotid endarterectomy site with no left internal      carotid artery stenosis.  3. No significant change noted when compared to the previous exam on      07/11/2005.   ___________________________________________  Quita Skye. Hart Rochester, M.D.   CH/MEDQ  D:  10/19/2009  T:  10/20/2009  Job:  [id number]

## 2011-05-09 NOTE — Consult Note (Signed)
NEW PATIENT CONSULTATION   [name], [name]  DOB:  1925/01/04                                       10/19/2009  [id number]#:[id number]   The patient is an 77 year old male patient referred by Dr. Delfin Edis  for a left carotid bruit with a known history of cerebrovascular  disease.  He denies any symptoms of cerebral ischemia such as  hemiparesis, aphasia, amaurosis fugax, diplopia, blurred vision or  syncope.  He had a previous right and left carotid endarterectomy  performed by me in 1990.  In 2001 he developed recurrent disease and was  found at that time to have an occluded right internal carotid artery  which is asymptomatic and a 75% left internal carotid artery stenosis  and he had a redo left carotid endarterectomy at that time.  He remains  asymptomatic but now has had a left carotid bruit heard by Dr. Deborah Chalk.   PAST MEDICAL HISTORY:  1. Hypertension.  Stable.  2. Coronary artery disease.  Stable.  3. Hyperlipidemia.  Stable.  4. Negative for COPD or diabetes or stroke.   PAST SURGICAL HISTORY:  1. Left carotid endarterectomy in 1990.  2. Redo left carotid endarterectomy in 2001.  3. Right carotid endarterectomy 1990.  4. Stenting of left renal artery in 2006.  5. Cholecystectomy.   FAMILY HISTORY:  Positive for stroke in his mother, cerebral hemorrhage  in his father, coronary artery disease in two brothers.  Negative for  diabetes.   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  Smoked a pipe in the past but has not  smoked in 50 years.  Does not use alcohol.   REVIEW OF SYSTEMS:  Denies any chest pain, dyspnea on exertion.  Does  have history of symptoms of peptic ulcer disease with dysphagia.  Has  had diffuse arthritis and floaters in his visual fields.  All other  systems are negative.  Please see encounter form.   ALLERGIES:  To doxycycline.   MEDICATIONS:  Please see  encounter form.   PHYSICAL EXAMINATION:  Vital signs:  Blood pressure is 186/67, heart  rate 64, respirations 14.  General:  He is an elderly male in no  apparent distress, alert and oriented x3.  Neck:  Supple, 3+ carotid  pulses palpable.  There is a soft bruit on the left side.  Neurological:  Normal.  No palpable adenopathy in the neck.  HEENT:  Negative.  Chest:  Clear to auscultation.  Cardiovascular:  Regular rhythm.  No murmurs.  Abdomen:  Soft, nontender with no masses.  He has 3+ femoral and  posterior tibial pulses bilaterally.  Both feet are well-perfused.   I ordered a carotid duplex exam today.  I then reviewed and interpreted  this and this reveals no flow reduction in his left internal carotid  artery with occlusion of his right internal carotid artery which is  chronic.   He does not had any evidence of significant carotid occlusive disease on  the left and his right carotid is chronically occluded which is stable.  There is no need for any further evaluation since he is having no  neurologic symptoms.  We will see him back on a p.r.n. basis.   Quita Skye Hart Rochester, M.D.  Electronically Signed   JDL/MEDQ  D:  10/19/2009  T:  10/20/2009  Job:  [id number]   cc:   Colleen Can. Deborah Chalk, M.D.  Sherrine Maples [employer]

## 2011-05-12 NOTE — Discharge Summary (Signed)
[facility]  Patient:    [name], [name]                  MRN: [id number] Adm. Date:  [id number] Disc. Date: 09/21/00 Attending:  Colvin Caroli Dictator:   Sherrie George, P.A. CC:         Colleen Can. Deborah Chalk, M.D.  Durene Fruits, M.D.   Discharge Summary  DATE OF BIRTH:  1925/05/28.  ADMISSION DIAGNOSES 1. Severe left common carotid stenosis with total right common carotid    occlusion. 2. Status post bilateral carotid endarterectomies in the 1990s by    Dr. Quita Skye. Hart Rochester. 3. Hypertension. 4. Coronary artery disease, status post myocardial infarction in 1987. 5. Hypercholesterolemia. 6. History of osteoarthritis and degenerative joint disease. 7. Mild renal insufficiency.  DISCHARGE DIAGNOSES 1. Severe left common carotid stenosis with total right common carotid    occlusion. 2. Status post bilateral carotid endarterectomies in the 1990s by    Dr. Quita Skye. Hart Rochester. 3. Hypertension. 4. Coronary artery disease, status post myocardial infarction in 1987. 5. Hypercholesterolemia. 6. History of osteoarthritis and degenerative joint disease. 7. Mild renal insufficiency. 8. Bradycardia.  PROCEDURE:  Redo left carotid endarterectomy with Dacron patch angioplasty, September 20, 2000, Dr. Hart Rochester.  BRIEF HISTORY:  The patient is a 77 year old white male, medical patient of Dr. Colleen Can. Deborah Chalk and also seen by Dr. Durene Fruits for his renal disease.  He is status post bilateral carotid endarterectomies approximately 11 years ago.  At that time, he had an 85% left ICA stenosis and 98% right ICA stenosis.  Over this long period of time, he has done well.  In June of 2001, he noted a light film over both eyes at certain times, lasting for 5 to 10 minutes; he also fell at home in the bathroom, although he does not recall why he fell.  Doppler studies were obtained in June 2001; this showed a left ECA velocity of 203 and a left ICA  velocity of 70 and the end-diastolic of 22. The patient was evaluated by Dr. Hart Rochester, who recommended cerebral angiogram. Arteriogram showed almost complete occlusion of the right distal common carotid and 50% distal left common carotid, with mild stenosis of the left vertebral.  The right vertebral is atretic.  Based on these findings, it was Dr. Rodell Perna recommendation that patient undergo redo left carotid endarterectomy.  Patient has also had an episode of blurred vision when he is looking up.  No other cerebrovascular problems have been noted.  PAST MEDICAL HISTORY:  Carotid artery disease with restenosis of the left common carotid, hypertension, hypercholesterolemia and an ejection fraction of 66%.  Osteoarthritis and degenerative joint disease.  He had a myocardial infarction in 1987.  He has chronic back pain.  He has a remote history of Raynauds cyst.  PAST SURGICAL HISTORY:  Bilateral carotid endarterectomies, cerebral angiogram and ear surgery in June 2001 by Dr. Carolan Shiver.  MEDICATIONS ON ADMISSION 1. Gemfibrozil 600 mg q.d. 2. Guaifenesin 1 mg q.d. 3. Atenolol 50 mg q.d. 4. Hydrochlorothiazide 25 mg q.d. 5. Aspirin 325 mg q.d. 6. Adalat 50 mg q.d. 7. Lipitor 40 mg q.d. 8. Potassium 10 mEq two p.o. q.d.  ALLERGIES:  None known.  For further history and physical, please see the dictated note.  HOSPITAL COURSE:  Patient presented with the above symptoms and after evaluation, was scheduled for a redo left carotid endarterectomy.  He was admitted through outpatient, taken to the operating room  and underwent the procedure without difficulty.  During the procedure, he did have some bradycardia with a rate down as low at 39.  The area was injected with lidocaine at the carotid bulb and he was also given some Robinul.  He continued to have bradycardia postop.  On the a.m. of the first postoperative morning, his heart rate was 50 and that was without any further  medications. He was actually treated with Apresoline overnight to keep his blood pressure down.  First operative morning, we gave him his Adalat and we gave him a half dose of his atenolol, which is 25 mg; with this, his blood pressure has been within a more normal range in the 120-150 range, systolically, and his heart rate has been as high as 70 while up walking.  His wounds look good.  JP was removed for minimal drainage and it was Dr. Rodell Perna opinion he could go home. We plan to have him go to the [facility] next week on Wednesday, October 3rd, and have his BMET rechecked and this will be called to our office and Dr. Colleen Can. Tennants office and we have instructed the patient to contact Dr. Deborah Chalk for followup in approximately two weeks.  Dr. Deborah Chalk was not available in his office but we did talk to Dr. Clovis Pu. Brackbill and he is aware of the patients postop bradycardia and elevated creatinine and will talk with Dr. Deborah Chalk about this and patient will set up followup with Dr. Deborah Chalk in two weeks.  DISCHARGE MEDICATIONS:  He will resume his home medicines as before and he will be given Tylox one to two p.o. q.4h. p.r.n.  FOLLOWUP:  He is instructed to contact Dr. Deborah Chalk for followup in two weeks. He will return to see Dr. Hart Rochester on Tuesday, October 16th, at 9 a.m. and he will go to the [facility] on Wednesday, October 3rd, for a BMET which will be called both to Dr. Hart Rochester and Dr. Deborah Chalk.  CONDITION ON DISCHARGE:  Improving. DD:  09/21/00 TD:  09/21/00 Job: [id number]

## 2011-05-12 NOTE — Op Note (Signed)
NAME:  [name], [name] NO.:  [id number]   MEDICAL RECORD NO.:  [id number]          PATIENT TYPE:  INP   LOCATION:  5731                         FACILITY:  [facility]   PHYSICIAN:  Quita Skye. Hart Rochester, M.D.  DATE OF BIRTH:  Nov 15, 1925   DATE OF PROCEDURE:  08/17/2005  DATE OF DISCHARGE:                                 OPERATIVE REPORT   PREOP DIAGNOSIS:  Renal vascular hypertension and renal insufficiency.   PROCEDURE:  1.  Abdominal aortogram via right common femoral approach.  2.  Left renal angiogram via selective catheterization, left renal artery.  3.  PTA and stenting of left renal artery using (a) 55 x 15 Genesis at 10      atmospheres for 15 seconds and 10 atmospheres for 30 seconds and (b)  5      x 12 Genesis at 10 atmospheres for 30 seconds with completion angiogram.   SURGEON:  Dr. Hart Rochester and Dr. Madilyn Fireman.   ANESTHESIA:  Local Xylocaine.   CONTRAST:  70 mL of contrast 150 mL of CO2.   COMPLICATIONS:  None.   ESTIMATED BLOOD LOSS:  75 mL.   DESCRIPTION OF PROCEDURE:  The patient was taken to the [facility], peripheral  endovascular placed in the supine position at which time the right groin was  prepped Betadine scrub solution, draped in routine sterile manner. After  infiltration of 1% Xylocaine, the right common femoral artery was entered  percutaneously. Guidewire passed into the suprarenal aorta under  fluoroscopic guidance. A 6-French sheath and dilator were passed over the  guide wire, dilator removed. Standard pigtail catheter positioned in the  suprarenal aorta. Three attempts at CO2 angiography were performed injecting  50 mL of CO2 on each occasion. This gave some information regarding the  tortuosity of the aorta but did not give enough detail of the renal arteries  to proceed with angioplasty. The patient's creatinine was 1.7 and it was  then decided to proceed with contrast angiography. Aortogram was performed  injecting of 20 mL of contrast at 20  mL per second. This revealed the right  renal artery to have a focal 95% stenosis at the origin with a good distal  vessel perfusing a smaller right kidney. The left renal artery had an  approximate 70-80% proximal stenosis with a much larger kidney. It was  decided to proceed with angioplasty and stenting of at least the left renal  artery at this point. 4000 units of heparin was given intravenously and a 6-  [place] renal double curved guidewire was initially utilized to attempt to  cannulate the left renal artery but the curve was not appropriate and this  was unsuccessful. This was exchanged for a 6-French IM  guide catheter and  the left renal orifice was engaged. A 0.014 stabilizer wire was then  advanced into the distal left renal artery. The proximal stenosis on the  left side was then dilated and stented using a 5 x 15 Genesis catheter which  was positioned appropriately and inflated at 10 atmospheres for 15 seconds.  The catheter advanced slightly forward because of the lesion during the  inflation.  Therefore the catheter was withdrawn slightly to bevel the  proximal end an inflation to 10 atmospheres for 30 seconds performed.  Completion angiogram through the guide catheter revealed a persistent  stenosis proximally. Therefore a second stent (5 mm x 12 mm - Genesis) was  advanced through the guide catheter overlapping the first stent and the  proximal end extending out to the aortic wall. This was inflated 10  atmospheres for 30 seconds.  Completion angiogram revealed an excellent  technical result with complete resolution of the left renal artery stenosis.  Attempts were made to cannulate the right renal artery but because of the  tortuosity of the aorta, the different guide catheters which were utilized  were all inappropriate as far as getting into the right renal orifice.  Because 70 mL of contrast had been utilized was decided to defer the right  renal artery PTA and  stenting to a later date for a trans brachial approach.  One additional dose of 2000 units of heparin was given after one hour of the  procedure. Following normalization of the ACT, the sheath was removed,  adequate compression applied. No complications ensued.   FINDINGS:  1.  Focal 95% proximal right renal artery stenosis.  2.  Focal 70-80% proximal left renal artery stenosis.  3.  Successful PTA and stenting of left renal artery using (a) 5 x 15      Genesis and (b) 5 x 12 Genesis systems for left renal artery stenosis      with successful PTA and stenting of left renal artery.           ______________________________  Quita Skye Hart Rochester, M.D.     JDL/MEDQ  D:  08/17/2005  T:  08/17/2005  Job:  [id number]

## 2011-05-12 NOTE — Op Note (Signed)
[facility]  Patient:    [name], [name]                    MRN: [id number] Proc. Date: 09/20/00 Attending:  Quita Skye. Hart Rochester, M.D. CC:         Colleen Can. Deborah Chalk, M.D.  Dr. Joycelyn Das   Operative Report  PREOPERATIVE DIAGNOSIS:  Recurrent left common and internal carotid stenosis with vague central nervous system symptoms and total occlusion of right carotid system.  POSTOPERATIVE DIAGNOSIS:  Recurrent left common and internal carotid stenosis with vague central nervous system symptoms and total occlusion of right carotid system.  OPERATION:  Redo left carotid endarterectomy with Dacron patch angioplasty.  SURGEON:  Quita Skye. Hart Rochester, M.D.  FIRST ASSISTANT:  Sherrie George, P.A.  ANESTHESIA:  General endotracheal.  BRIEF HISTORY:  The patient previously underwent a left carotid endarterectomy in 1990 and has also had a right carotid endarterectomy in the past.  Recently he had further evaluation because of developing a "film" over both eyes at times and had one near-syncopal episode in his bathroom of unknown etiology. A Doppler study revealed severe disease on the right side and some moderate disease on the left side, and cerebral angiograms revealed a totally occluded right internal carotid artery with a recurrent stenosis of the left common carotid artery, which approximates 60-75%, with irregularity in the endarterectomy site.  He was scheduled for a redo left carotid endarterectomy.  DESCRIPTION OF PROCEDURE:  The patient was taken to the operating room and placed in the supine position, at which time satisfactory general endotracheal anesthesia was administered.  The left neck was prepped with Betadine scrub and solution and draped in the routine sterile manner.  An incision was made through the previous scar on the left neck, carried down through the subcutaneous tissue anterior to the sternocleidomastoid muscle.  The  common carotid artery was dissected free low in the neck, and the vagus nerve was identified and carefully avoided.  The entire common, internal, and external carotid artery was then dissected free distally, with the endarterectomy having been performed from about 3 cm proximal to the bifurcation up to about 4-5 cm distal to the bifurcation, and the patch was in place.  There were a lot of degenerative atherosclerotic changes in the distal common carotid and throughout the endarterectomy site.  Care was taken not to injure the hypoglossal nerve, which was carefully identified and avoided.  The patient was then heparinized, carotid vessels occluded with vascular clamps, longitudinal opening made in the common carotid with a 15 blade, extended with the Potts scissors throughout the length of the patch and proximally down 2-3 cm proximal to the patch.  There was severe atherosclerosis in the distal common carotid artery, with at least a 75% stenosis and a lot of soft atheromatous debris.  There were also ulcerative changes of recurrent atherosclerosis throughout the endarterectomized internal carotid artery, with no severe or significant stenosis but a lot of loose debris.  There was excellent back-bleeding coming from the distal vessel.  A #10 shunt was inserted without difficulty, re-establishing flow in about two minutes.  A redo endarterectomy was then performed throughout the entire length of the arteriotomy from proximal to the patch to distal to the previous patch, and the old patch was completely excised from the vessel.  This was done in the usual layer for endarterectomy.  When this was completed, the plaque feathered off the distal vessel nicely and the lumen was thoroughly  irrigated with heparin and saline.  All loose debris was carefully removed, and the arteriotomy was closed with a patch using continuous 6-0 Prolene.  Prior to completion of the closure, the shunt was removed  after about 45 minutes of shunt time.  Following antegrade and retrograde flushing, the closure was completed, re-establishing flow at the external and then the internal branch. The carotid was occluded for less than two minutes for removal of the shunt. Protamine was then given to reverse the heparin.  Following adequate hemostasis, the wound was irrigated with saline, a Jackson-Pratt drain brought out through an inferiorly-based stab wound and secured with a nylon suture, and the wound closed in layers with Vicryl in a subcuticular fashion, a sterile dressing applied, and the patient taken to the recovery room in satisfactory condition. DD:  09/20/00 TD:  09/20/00 Job: [id number]

## 2011-05-12 NOTE — H&P (Signed)
[facility]  Patient:    [name], [name]                    MRN: [id number] Adm. Date:  09/20/00 Attending:  Quita Skye. Hart Rochester, M.D. Dictator:   Marlowe Kays, P.A. CC:         Colleen Can. Deborah Chalk, M.D.   History and Physical  DATE OF BIRTH:  [birth year]  CHIEF COMPLAINT:  Recurrent left carotid artery stenosis.  REFERRING PHYSICIAN:  Dr. Evelene Croon.  HISTORY OF PRESENT ILLNESS:  A 77 year old white married male with known history of carotid artery disease, status post bilateral carotid endarterectomy about 11 years ago.  At that time, he had about 85% left ICA stenosis and 98% right ICA stenosis.  Over this long period of time, he had done well.  In June 2001, however, he noted a light film over both eyes at certain times lasting for about five to 10 minutes.  Also, he fell at home in the bathroom, although he does not recall the way he fell.  All he remembers is that once he woke up, he was bleeding from his left ear, which apparently got cut for which he had repair surgery.  The Dopplers in June 2001 showed a left ECA velocity of 203, left ICA, with systolic of 70, and end diastolic of 22.  The patient was evaluated by Dr. Hart Rochester who recommended a cerebral angiogram.  On June 05, 2000, the patient underwent an angiogram which showed almost complete occlusion of the right distal CCA and 50% distal left CCA mild stenosis of the left vertebral.  The right vertebral is atretic.  Based on the findings, and the signs and symptoms, Dr. Hart Rochester recommended a re-do left CEA on September 20, 2000, to reduce the risk of stroke.  In the weeks prior to surgery, the patient states that he still experiences dizziness, especially when looking up.  Also, he has an episode of blurred vision as he is looking up, the last episode was on September 18, 2000, which lasted a few seconds. The patient attributes these blurred vision episode to cataracts, although they only occur  when the patient tilts his head up.  No weakness or paresthesias.  No facial droop or difficulty standing or walking.  No new episodes of falling.  No impaired speech.  No headache, nausea, vomiting, or vertigo.  No seizures.  He does have mild discomfort after he swallows.  He has no trouble swallowing for the first time, although he comments on the food "really going down once I swallow for the second time."  No memory loss or confusion.  PAST MEDICAL HISTORY: 1. Carotid artery disease with restenosis of the left common carotid artery. 2. Hypertension. 3. Hypercholesterolemia. 4. Ejection fraction 66%. 5. Osteoarthritis - degenerative joint disease. 6. Status post myocardial infarction, 1987. 7. Chronic back pain. 8. Remote history of Raynauds cyst, resolved.  PAST SURGICAL HISTORY: 1. Status post bilateral CEA in 1990 by Dr. Hart Rochester. 2. Status post cerebral angiogram, June 05, 2000. 3. "Ear surgery," Dr. Brock Bad, after falling, in June 2001.  MEDICATIONS: 1. Gemfibrozil 600 mg q.d. 2. Guanfacine 1 mg q.d. 3. Atenolol 50 mg q.d. 4. HCTZ 25 mg q.d. 5. Aspirin 325 mg q.d. 6. Adalat 50 mg q.d. 7. Lipitor 40 mg q.d. 8. Klor-Con 10 mEq two p.o. q.d.  ALLERGIES:  No known drug allergies, although he might have had some adverse reaction to morphine when hospitalized last time.  REVIEW OF  SYSTEMS:  See history of present illness and past medical history for significant positives.  Otherwise, the patient denies any diabetes or kidney disease.  No new episodes of syncope or presyncope.  No angina or arrhythmia.  No PE or DVT.  He does have mild shortness of breath on exertion. No dysuria.  He has occasional microhematuria which was evaluated by Dr. Evelene Croon, without finding the etiology of this microhematuria.  He has chronic back pain, which at times extends bilaterally toward the feet, and is relieved by ambulation.  FAMILY HISTORY:  Mother died of CVA, father died of cerebral  hemorrhage.  SOCIAL HISTORY:  The patient is married.  Some time in June, his wife had an MI with respiratory arrest, and, as a consequence, she experienced hypoxia, which left his wife "brain damaged" for which the patient now is caring for her "24 hours a day."  He has three children in good health.  He is retired. He denies any tobacco or alcohol intake.  PHYSICAL EXAMINATION:  A well-developed, thin 77 year old white man in no acute distress, alert and oriented x 3.  Blood pressure 120/80 on the right, 150/60 on the left, pulse 60, respirations 16.  Head normocephalic.  There is a left ear area of trauma after he fell in June 2001.  PERRLA, EOMI.  There are bilateral cataract formations, and a left questionable old hemorrhage changes on funduscopic exam.  Neck supple, no JVD.  There are bilateral bruits audible, more pronounced on the left than on the right.  No thyromegaly or lymphadenopathy.  There is a well-healed bilateral endarterectomy scar.  Chest symmetrical on inspiration.  Lungs clear to auscultation bilaterally. Cardiovascular:  Regular rate and rhythm without murmurs, rubs, or gallops. Abdomen is soft and nontender.  Bowel sounds x 4 quadrants.  No hepatosplenomegaly.  There is a questionable midline mass, which is accompanied by a high-pitched periumbilical bruit.  This area is nontender. GU/RECTAL:  Deferred.  Extremities:  No clubbing, cyanosis, or edema.  Skin shows no ulcerations, normal hair pattern, and warm temperature.  Peripheral pulses:  Carotid 2+ bilaterally with a harsh bruit on the left and a very soft bruit on the right.  Femoral 2+ bilaterally without bruits.  Popliteal, dorsalis pedis, and posterior tibialis 2+ bilaterally.  Neurologic grossly nonfocal.  Normal gait.  DTRs 2+ bilaterally.  Muscle strength 5/5.  ASSESSMENT AND PLAN:  A 77 year old white male with known history of bilateral carotid artery disease, symptomatic, who will undergo re-do left  carotid  endarterectomy on September 20, 2000, at [facility].  Dr. Hart Rochester has seen and evaluated this patient prior to this admission and has explained the risks and benefits involving the procedure and the patient has agreed to continue. DD:  09/18/00 TD:  09/18/00 Job: [id number]

## 2011-05-12 NOTE — H&P (Signed)
NAME:  [name], [name] NO.:  [id number]   MEDICAL RECORD NO.:  [id number]          PATIENT TYPE:  INP   LOCATION:  5731                         FACILITY:  [facility]   PHYSICIAN:  Quita Skye. Hart Rochester, M.D.  DATE OF BIRTH:  1925/09/18   DATE OF ADMISSION:  08/16/2005  DATE OF DISCHARGE:                                HISTORY & PHYSICAL   REASON FOR ADMISSION:  Renal artery stenosis.   HISTORY OF PRESENT ILLNESS:  The patient is a 77 year old male who is well  known to [facility].  He has previously undergone bilateral carotid  endarterectomies by Dr. Hart Rochester in the 90s, as well as a redo left carotid  endarterectomy in 2001.  He was recently seen in the office by Dr. Hart Rochester,  upon referral from Dr. Roger Shelter for evaluation of possible renal  artery stenosis.  The patient had a history of a mild renal insufficiency  and is hypertensive on multiple medications.  This has been followed for  some time now by Dr. Deborah Chalk.  He recently obtained an MR angiogram which  showed an atrophic right kidney with severe proximal right renal artery  stenosis.  There was also a left renal artery stenosis around 60-80% with a  questionable small accessory renal artery on the left.  There was also a  proximal stenosis of the celiac axis with a normal superior mesenteric  artery.  Dr. Hart Rochester saw the patient and reviewed his films and felt that his  best course of action would be to proceed with arteriography at this time to  further delineate his anatomy.  Because of his previous history of renal  insufficiency, he will need to undergo a CO2 angiogram and based on the  findings will probably undergo a left and/or right renal artery PTA and  stent placement.  The patient presents today for pre-procedural history and  physical.   PAST MEDICAL HISTORY:  1.  ECCVOD.  2.  Chronically occluded right internal carotid artery.  3.  Coronary artery disease, status post myocardial infarction.  4.   Hypertension.  5.  Dyslipidemia.  6.  Osteoarthritis.  7.  Degenerative joint disease.  8.  Benign prostatic hypertrophy.  9.  Allergic rhinitis.  10. Mild renal insufficiency with creatinine stable around 2.0.   PAST SURGICAL HISTORY:  1.  Right carotid endarterectomy in March 1990, by Dr. Hart Rochester.  2.  A right carotid endarterectomy in March 1990, by Dr. Hart Rochester.  3.  Tympanomastoidectomy with ossiculoplasty in 1990, by Dr. Dorma Russell.  4.  Redo left carotid endarterectomy by Dr. Hart Rochester in 2001.   CURRENT MEDICATIONS:  1.  Tenex 1 mg q.h.s.  2.  Hydrochlorothiazide 25 mg q.d.  3.  Klor-Con 10 mEq b.i.d.  4.  Aspirin 325 mg q.d.  5.  Benicar 20 mg, one fourth of a tablet q.d.  6.  Lopid 600 mg b.i.d.  7.  Clarinex 5 mg q.h.s.  8.  Atenolol 50 mg q.a.m. and 25 mg q.p.m.  9.  Lipitor 40 mg b.i.d.  10. Adalat CC 60 mg q.d.  11. Avodart 0.5 mg q.d.   ALLERGIES:  No known drug allergies.   SOCIAL HISTORY:  He is married, and he has three children.  He resides in  [place] with his wife who is an invalid and for whom he takes complete care.  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  He previously smoked, around a fourth of a pack of cigarettes per  week for 6-7 years.  He quit 40 years ago.  He does not consume alcohol.   FAMILY HISTORY:  His mother died at age 30 of a CVA.  His father died of a  cerebral hemorrhage.  He has two siblings who died of coronary artery  disease.  There is a strong family history of coronary artery disease,  myocardial infarction, hypertension, and CVA.   REVIEW OF SYSTEMS:  See history of present illness for pertinent positives  and negatives.  He does have a history of sinus headaches and allergic  rhinitis.  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  He wears  reading glasses.  He complains of cramping in his legs at night.  He  bruises  easily. He has had recurrent urinary tract infections and is followed by a  urologist in [place].  He also has some urinary hesitancy and takes  Avodart for BPH.  He has pedal edema and usually wears support stockings.  He denies fevers, chills, weight loss, recent infections, visual changes,  amaurosis fugax, dysphagia, syncope, TIA symptoms, chest pain, palpitations,  dyspnea, orthopnea, paroxysmal nocturnal dyspnea, dyspnea on exertion,  nausea, vomiting, diarrhea, constipation, abdominal pain, reflux symptoms,  dysuria, hematuria, joint pain, swelling, muscle pain or weakness,  intolerance to heat or cold, anxiety, depression, rest pain, claudication  symptoms, or nonhealing ulcers.   PHYSICAL EXAMINATION:  VITAL SIGNS:  Blood pressure is 138/64, heart rate  64, respirations 18.  GENERAL:  This is an elderly white male in no acute distress.  HEENT:  Normocephalic, atraumatic.  Pupils equal, round, and react to light  and accommodation.  Extraocular movements intact.  Both ear canals are  occluded with cerumen.  Nares patent bilaterally.  Oropharynx clear with  upper and lower dentures in place.  NECK:  Supple without lymphadenopathy.  He has bilateral well healed carotid  endarterectomy scars.  There is a soft bruit on the left with none on the  right.  HEART:  Regular rate and rhythm without murmurs, rubs or gallops.  LUNGS:  Clear to auscultation.  ABDOMEN:  Soft, nontender, nondistended with active bowel sounds in all  quadrants.  No masses or hepatosplenomegaly.  EXTREMITIES:  He has mild trace pedal edema bilaterally.  He has 2+ femoral  and 1+ dorsalis pedis and posterior tibial pulses bilaterally.  Feet are  warm and well-perfused.   ASSESSMENT:  This is a 77 year old male with a history of hypertension and  bilateral renal artery stenoses by MR angiogram.   PLAN:  He will be admitted to [facility] on August 16, 2005 for IV hydration and Mucomyst  therapy in preparation for a CO2 angiogram on August 17, 2005.  Based on the findings of this, he may also undergo a left or  right renal artery stent placement or PTA by Dr. Hart Rochester.      Coral Ceo, P.A.    ______________________________  Quita Skye Hart Rochester, M.D.    GC/MEDQ  D:  08/16/2005  T:  08/16/2005  Job:  [id number]   cc:   Colleen Can. Deborah Chalk, M.D.  Fax: [phone]   [facility]  [address]  [place]  [place] [address]  Fax: [phone]

## 2011-05-29 ENCOUNTER — Telehealth: Payer: Self-pay | Admitting: Cardiology

## 2011-05-29 NOTE — Telephone Encounter (Signed)
DAUGHTER WANTS TO KNOW IF PT CAN BE REFERRED SOMEWHERE FOR ALZHEIMERS??

## 2011-05-29 NOTE — Telephone Encounter (Signed)
RN returned pt's daughter Steward Drone) call.  Steward Drone would like a referral to see a dr for her father for Alzheimer's.  Dr. Deborah Chalk notified and instructed RN to have pt go thru PCP.  Steward Drone notified of Dr. Ronnald Nian instructions.

## 2011-07-11 ENCOUNTER — Encounter: Payer: Self-pay | Admitting: Cardiovascular Disease

## 2011-07-11 ENCOUNTER — Ambulatory Visit (INDEPENDENT_AMBULATORY_CARE_PROVIDER_SITE_OTHER): Payer: Medicare Other | Admitting: Cardiovascular Disease

## 2011-07-11 DIAGNOSIS — R55 Syncope and collapse: Secondary | ICD-10-CM

## 2011-07-11 DIAGNOSIS — S065X9A Traumatic subdural hemorrhage with loss of consciousness of unspecified duration, initial encounter: Secondary | ICD-10-CM

## 2011-07-11 DIAGNOSIS — E785 Hyperlipidemia, unspecified: Secondary | ICD-10-CM

## 2011-07-11 DIAGNOSIS — I6529 Occlusion and stenosis of unspecified carotid artery: Secondary | ICD-10-CM | POA: Insufficient documentation

## 2011-07-11 DIAGNOSIS — I6200 Nontraumatic subdural hemorrhage, unspecified: Secondary | ICD-10-CM

## 2011-07-11 DIAGNOSIS — I498 Other specified cardiac arrhythmias: Secondary | ICD-10-CM

## 2011-07-11 DIAGNOSIS — I679 Cerebrovascular disease, unspecified: Secondary | ICD-10-CM

## 2011-07-11 DIAGNOSIS — I2510 Atherosclerotic heart disease of native coronary artery without angina pectoris: Secondary | ICD-10-CM

## 2011-07-11 DIAGNOSIS — R001 Bradycardia, unspecified: Secondary | ICD-10-CM | POA: Insufficient documentation

## 2011-07-11 DIAGNOSIS — S065XAA Traumatic subdural hemorrhage with loss of consciousness status unknown, initial encounter: Secondary | ICD-10-CM

## 2011-07-11 NOTE — Assessment & Plan Note (Signed)
Currently with no symptoms of angina. No further workup at this time. Continue current medication regimen. We did ask that the daughter established care with neurology to determine whether aspirin can be restarted.

## 2011-07-11 NOTE — Assessment & Plan Note (Signed)
Previous subdural hematoma with discontinuation of aspirin at that time. We'll need to determine if aspirin can be restarted now that we are several months out from this event.

## 2011-07-11 NOTE — Assessment & Plan Note (Signed)
We'll try to obtain his most recent carotid ultrasound from [facility]. Continue aggressive lipid management.

## 2011-07-11 NOTE — Patient Instructions (Signed)
You are doing well. Please hold the atenolol. Monitor blood pressure. If elevated in the AM, please call the office.  Please call us if you have new issues that need to be addressed before your next appt.  We will call you for a follow up Appt. In 6 months

## 2011-07-11 NOTE — Progress Notes (Signed)
Patient ID: [name], male    DOB: [birth year], 77 y.o.   MRN: [id number]  HPI Comments: 77 year old gentleman with history of syncope, coronary artery disease, stroke in October 2011, subdural hematoma after the fall who presents to establish care.  His daughter reports that overall he has been doing well. No further near syncope or syncope. He has not had followup with neurology for his subdural hematoma. He did have a followup MRI ordered by Dr. Maylon Cos. His daughter reports that he has underlying dementia and is considering evaluation with neurology for dementia. He was taken off his Aggrenox in the past and aspirin was not restarted in the setting of the subdural hematoma.   He uses a walker to ambulate. Otherwise has no complaints  He had a myocardial infarction in 1987. His last catheterization was in 1996 which showed normal left ventricular function with three-vessel coronary atherosclerosis involving a 90% right coronary artery, 90% proximal obtuse marginal, and 50-60% mid LAD he an ejection fraction of 45-50% in September 2011 by echocardiography.  He has remained stable from a cardiac standpoint but he is getting progressively frail.  EKG shows sinus bradycardia with rate 48 beats per minute, left bundle branch block   Outpatient Encounter Prescriptions as of 07/11/2011  Medication Sig Dispense Refill  . atenolol (TENORMIN) 25 MG tablet Take 25 mg by mouth daily.       . Cholecalciferol (VITAMIN D3) 1000 UNITS tablet Take 1,000 Units by mouth daily.        . cloNIDine (CATAPRES - DOSED IN MG/24 HR) 0.1 mg/24hr patch Place 1 patch onto the skin once a week.        . docusate sodium (COLACE) 100 MG capsule Take 100 mg by mouth daily.       Marland Kitchen escitalopram (LEXAPRO) 10 MG tablet Take 10 mg by mouth daily.        Marland Kitchen esomeprazole (NEXIUM) 40 MG capsule Take 40 mg by mouth daily before breakfast.        . furosemide (LASIX) 40 MG tablet Take 40 mg by mouth as needed.        .  isosorbide mononitrate (IMDUR) 60 MG 24 hr tablet Take 1 tablet (60 mg total) by mouth daily.  30 tablet  6  . magnesium oxide (MAG-OX) 400 MG tablet Take 400 mg by mouth daily.        . Multiple Vitamins-Minerals (MULTIVITAMIN) tablet Take 1 tablet by mouth daily.  30 tablet    . NIFEdipine (PROCARDIA XL/ADALAT-CC) 60 MG 24 hr tablet Take 60 mg by mouth daily.        . polyethylene glycol powder (GLYCOLAX/MIRALAX) powder Take 17 g by mouth daily.       . pravastatin (PRAVACHOL) 20 MG tablet Take 20 mg by mouth daily.        . Tamsulosin HCl (FLOMAX) 0.4 MG CAPS Take 0.4 mg by mouth daily.        . Ascorbic Acid (VITAMIN C) 500 MG tablet Take 1 tablet (500 mg total) by mouth daily.  30 tablet       Review of Systems  Constitutional: Negative.   HENT: Negative.   Eyes: Negative.   Respiratory: Negative.   Cardiovascular: Negative.   Gastrointestinal: Negative.   Musculoskeletal: Positive for back pain, arthralgias and gait problem.  Skin: Negative.   Neurological: Negative.   Hematological: Negative.   Psychiatric/Behavioral: Negative.   All other systems reviewed and are negative.    BP  162/78  Pulse 48  Resp 18  Wt 125 lb (56.7 kg)  Physical Exam  Nursing note and vitals reviewed. Constitutional: He is oriented to person, place, and time. He appears well-developed and well-nourished.  HENT:  Head: Normocephalic.  Nose: Nose normal.  Mouth/Throat: Oropharynx is clear and moist.  Eyes: Conjunctivae are normal. Pupils are equal, round, and reactive to light.  Neck: Normal range of motion. Neck supple. No JVD present. Carotid bruit is present.  Cardiovascular: Normal rate, regular rhythm, S1 normal, S2 normal and intact distal pulses.  Exam reveals no gallop and no friction rub.   Murmur heard.  Crescendo systolic murmur is present with a grade of 2/6  Pulmonary/Chest: Effort normal and breath sounds normal. No respiratory distress. He has no wheezes. He has no rales. He  exhibits no tenderness.  Abdominal: Soft. Bowel sounds are normal. He exhibits no distension. There is no tenderness.  Musculoskeletal: Normal range of motion. He exhibits no edema and no tenderness.  Lymphadenopathy:    He has no cervical adenopathy.  Neurological: He is alert and oriented to person, place, and time. Coordination normal.  Skin: Skin is warm and dry. No rash noted. No erythema.  Psychiatric: He has a normal mood and affect. His behavior is normal. Judgment and thought content normal.           Assessment and Plan

## 2011-07-11 NOTE — Assessment & Plan Note (Signed)
He has significant peripheral vascular disease and coronary artery disease. We will check his cholesterol at his convenience.

## 2011-07-11 NOTE — Assessment & Plan Note (Addendum)
Previous episode of syncope was felt to be multifactorial. Blood pressure is adequate. Heart rate is slow and we will hold the atenolol. Daughter reports at home, blood pressure is 135 systolic on average.

## 2011-07-11 NOTE — Assessment & Plan Note (Signed)
He does have bradycardia on today's visit. We'll hold his atenolol which he takes in the evening. We have asked his daughter to monitor his blood pressure. If his blood pressure is elevated in the morning, we could add Imdur 30 mg q.h.s.Marland Kitchen

## 2011-07-12 ENCOUNTER — Other Ambulatory Visit: Payer: Self-pay | Admitting: Cardiology

## 2011-07-12 NOTE — Telephone Encounter (Signed)
Fax received from pharmacy. Refill completed/per lori np. Alfonso Ramus RN

## 2011-07-13 ENCOUNTER — Other Ambulatory Visit: Payer: Self-pay | Admitting: *Deleted

## 2011-07-13 NOTE — Telephone Encounter (Signed)
DUPLICATE

## 2011-07-13 NOTE — Telephone Encounter (Signed)
Duplicate refill request.

## 2011-07-19 ENCOUNTER — Other Ambulatory Visit: Payer: Self-pay | Admitting: Cardiology

## 2011-07-21 NOTE — Telephone Encounter (Signed)
rx request 

## 2011-07-28 ENCOUNTER — Other Ambulatory Visit: Payer: Self-pay | Admitting: Cardiovascular Disease

## 2011-07-28 DIAGNOSIS — I259 Chronic ischemic heart disease, unspecified: Secondary | ICD-10-CM

## 2011-07-28 MED ORDER — ISOSORBIDE MONONITRATE ER 60 MG PO TB24: ORAL_TABLET | ORAL | Status: DC

## 2011-07-28 NOTE — Telephone Encounter (Signed)
Needs 45 tablets sent to [facility] for isosorbide.

## 2011-07-28 NOTE — Telephone Encounter (Signed)
Medication was increased and the patient needs a new Rx called in to increase from 30 to 45.  Please call the daughter once this has been completed.

## 2011-08-08 ENCOUNTER — Other Ambulatory Visit: Payer: Self-pay | Admitting: Cardiovascular Disease

## 2011-08-08 ENCOUNTER — Telehealth: Payer: Self-pay | Admitting: *Deleted

## 2011-08-08 NOTE — Telephone Encounter (Signed)
Calling because pt had another episode, feeling very weak, fatigue. Pt's atenolol was held due to bradycardia. His BP has ran high ~ 195/101 HR 103, he took 1/2 tab of Imdur as instructed at last ov. BP then decr to 145/85 HR 90. Today his BP 158/78, HR 89. Pt's daughter very concerned with him and asks if he can be seen today or tomorrow. I told her there are no openings today or tomorrow, but will discuss with Dr. Mariah Milling to see what he recommends.

## 2011-08-08 NOTE — Telephone Encounter (Signed)
Needs a refill on magnesium oxide 400 mg take one tablet daily.

## 2011-08-09 ENCOUNTER — Inpatient Hospital Stay: Payer: Medicare Other | Admitting: Internal Medicine

## 2011-08-09 DIAGNOSIS — I214 Non-ST elevation (NSTEMI) myocardial infarction: Secondary | ICD-10-CM

## 2011-08-09 NOTE — Telephone Encounter (Signed)
Blood pressure this am @ 7:15 213/115 with heart rate 117 with feeling dizzy with pain in head. Blood pressure @ 8:55 160/91 heart rate 98.  Please call  [phone] Vernon Prey (daughter).   Notified patient's daughter per Dr. Mariah Milling need to have patient start back on Atenolol and monitor blood pressure and heart rate for the next two hours and call the office.  The daughter did not agree with those instructions and felt the need to have him admitted.  I told her to have EMS take him if she felt he was not able by car.

## 2011-08-10 ENCOUNTER — Encounter: Payer: Self-pay | Admitting: Cardiovascular Disease

## 2011-08-14 ENCOUNTER — Telehealth: Payer: Self-pay | Admitting: Physician Assistant

## 2011-08-14 ENCOUNTER — Other Ambulatory Visit: Payer: Self-pay | Admitting: Cardiovascular Disease

## 2011-08-14 ENCOUNTER — Other Ambulatory Visit: Payer: Self-pay | Admitting: *Deleted

## 2011-08-14 DIAGNOSIS — N289 Disorder of kidney and ureter, unspecified: Secondary | ICD-10-CM

## 2011-08-14 DIAGNOSIS — I10 Essential (primary) hypertension: Secondary | ICD-10-CM

## 2011-08-14 NOTE — Telephone Encounter (Signed)
Pt's daughter called somewhat upset this evening about a medication she spoke with someone at Dr. Windell Hummingbird office about. She states she spoke with a nurse earlier at our office and was told hydrochlorothiazide would be called in, qid dosing, prn systolic bp >150 since her father's blood pressure has been so high lately. I discussed this with Dr. Myrtis Ser as there was no record of this conversation or documentation of the medicine being called in. Dr. Myrtis Ser discussed this more with the patient who states it may have been hydralazine instead. Per discussion with Dr. Myrtis Ser, will call in 25mg  po tid prn sbp>150 [facility] at [facility]. Will forward to Dr. Neville Route for corrections/clarification of medication.

## 2011-08-14 NOTE — Telephone Encounter (Signed)
Pt BP trending up all night long. Initially SBP 160s w/ HR 60s, by this am, 228/116, HR 95. She gave him Rx just after that reading. Advised her she did the right thing. He seems a little SOB to her but he denies any Sx. He is no longer on Atenolol but is now on Lopressor 50 bid. Advised her to increase that to 1-1/2 tab bid and will contact ofc to give her a call.

## 2011-08-14 NOTE — Telephone Encounter (Signed)
Pt's daughter called with BP results. AT MN 182/95, 0230 200/116, then AM meds taken (Imdur 120mg  and Metoprolol Tartrate 50) and BP went to 134/74. Then again at noon 190/84. Pt takes Nifedipine at noon, so now at 4PM his SBP again is 134. Discussed with Dr. Mariah Milling, ordered renal arterial u/s, and added hydralazine  50mg  QID prn for SBP >150. She will record BP BID, and pt will f/u this Friday with DR. Mariah Milling, will try to add on u/s tomorrow.

## 2011-08-15 ENCOUNTER — Encounter (INDEPENDENT_AMBULATORY_CARE_PROVIDER_SITE_OTHER): Payer: Medicare Other | Admitting: *Deleted

## 2011-08-15 DIAGNOSIS — I714 Abdominal aortic aneurysm, without rupture: Secondary | ICD-10-CM

## 2011-08-15 DIAGNOSIS — N289 Disorder of kidney and ureter, unspecified: Secondary | ICD-10-CM

## 2011-08-15 DIAGNOSIS — I10 Essential (primary) hypertension: Secondary | ICD-10-CM

## 2011-08-15 MED ORDER — HYDRALAZINE HCL 50 MG PO TABS: 50.0000 mg | ORAL_TABLET | Freq: Four times a day (QID) | ORAL | Status: DC | PRN

## 2011-08-15 NOTE — Telephone Encounter (Signed)
0825-Spoke to pt's daughter this AM, apologized that her prescription did not go through. She has no complaints and is understandable. Pt did take Hydralazine 25mg  last night (per previous note) and his BP elevated early this AM at ~180 systolic. I did speak with her yesterday at around 4:30pm, I had not signed off on note at that time, but we discussed her father's elevated BP since hospital discharge. His BP at MN was 182/95, 0230 228/116 HR 95, AM meds (Imdur 120mg /Metoprolol tart 50) given at 0800 then BP 134/74. At noon incr again to 190/84 HR 82. Pt takes his Nifedipine at noon. Discussed with Dr. Mariah Milling, he advised pt start Hydralazine 50mg  QID prn for SBP >150, and have renal arterial u/s due to renal insufficiency and HTN, and f/u with him this Friday. Pt scheduled for u/s today, and will see TG Fri. Pt's pharmacy was called and gave verbal order for new med. Pt's daughter ok with this.

## 2011-08-15 NOTE — Telephone Encounter (Signed)
Addended by: Lanny Hurst E on: 08/15/2011 11:08 AM   Modules accepted: Orders

## 2011-08-18 ENCOUNTER — Encounter: Payer: Self-pay | Admitting: *Deleted

## 2011-08-18 ENCOUNTER — Ambulatory Visit (INDEPENDENT_AMBULATORY_CARE_PROVIDER_SITE_OTHER): Payer: Medicare Other | Admitting: Cardiovascular Disease

## 2011-08-18 ENCOUNTER — Encounter: Payer: Self-pay | Admitting: Cardiovascular Disease

## 2011-08-18 DIAGNOSIS — I6200 Nontraumatic subdural hemorrhage, unspecified: Secondary | ICD-10-CM

## 2011-08-18 DIAGNOSIS — I2510 Atherosclerotic heart disease of native coronary artery without angina pectoris: Secondary | ICD-10-CM

## 2011-08-18 DIAGNOSIS — S065X9A Traumatic subdural hemorrhage with loss of consciousness of unspecified duration, initial encounter: Secondary | ICD-10-CM

## 2011-08-18 DIAGNOSIS — R609 Edema, unspecified: Secondary | ICD-10-CM | POA: Insufficient documentation

## 2011-08-18 DIAGNOSIS — N289 Disorder of kidney and ureter, unspecified: Secondary | ICD-10-CM

## 2011-08-18 DIAGNOSIS — I6529 Occlusion and stenosis of unspecified carotid artery: Secondary | ICD-10-CM

## 2011-08-18 DIAGNOSIS — I10 Essential (primary) hypertension: Secondary | ICD-10-CM

## 2011-08-18 DIAGNOSIS — E785 Hyperlipidemia, unspecified: Secondary | ICD-10-CM

## 2011-08-18 NOTE — Assessment & Plan Note (Signed)
We're suggest he continue on pravastatin daily.

## 2011-08-18 NOTE — Progress Notes (Signed)
Patient ID: [name], male    DOB: 1925/07/25, 77 y.o.   MRN: [id number]  HPI Comments: 77 year old gentleman with history of syncope, coronary artery disease, stroke in October 2011, subdural hematoma after the fall, Mild-to-moderate aortic valve stenosis, mild to moderate pulmonary hypertension with severe TR, ejection fraction 25-35%, Recent admission to the hospital for headaches, severe hypertension, "swimmy headed "with a recent fall.  He was admitted to [facility] on August 15. He had a CT scan which was essentially normal and did not show subdural bleed processes old subdural bleed had resolved post menses, there was chronic small vessel ischemic disease, troponin climbed greater than 8, chronic left bundle branch block seen. He was managed medically secondary to underlying renal disease and his age and as he had no symptoms. His medications were advanced for blood pressure control.  Blood pressures have been ranging in the 160-180 range, occasional 140-150  He presents today and his daughter reports that his blood pressure continues to be elevated, particularly in the morning.  He uses a walker to ambulate. Otherwise has no complaints  He had a myocardial infarction in 1987. His last catheterization was in 1996 which showed normal left ventricular function with three-vessel coronary atherosclerosis involving a 90% right coronary artery, 90% proximal obtuse marginal, and 50-60% mid LAD he an ejection fraction of 45-50% in September 2011 by echocardiography.  He has remained stable from a cardiac standpoint but he is getting progressively frail.  Recent echocardiogram done August 10 2011 shows ejection fraction 25-35%, diastolic dysfunction, moderate LVH, wall motion abnormality in the inferior, posterior regions, mild to moderate MR, severe TR, right ventricular systolic pressure at 40-50 mmHg, mild AI, mild to moderate left ear, AS is moderate by valve area, mild by  gradient  EKG shows  sinus bradycardia with rate 59 beats per minute, left bundle branch block   Outpatient Encounter Prescriptions as of 08/18/2011  Medication Sig Dispense Refill  . Ascorbic Acid (VITAMIN C) 500 MG tablet Take 1 tablet (500 mg total) by mouth daily.  30 tablet    . Cholecalciferol (VITAMIN D3) 1000 UNITS tablet Take 1,000 Units by mouth daily.        . cloNIDine (CATAPRES - DOSED IN MG/24 HR) 0.1 mg/24hr patch Place 1 patch onto the skin once a week.        . docusate sodium (COLACE) 100 MG capsule Take 100 mg by mouth daily.       Marland Kitchen escitalopram (LEXAPRO) 10 MG tablet TAKE 1 TABLET BY MOUTH EVERY DAY  30 tablet  5  . esomeprazole (NEXIUM) 40 MG capsule Take 40 mg by mouth daily before breakfast.        . furosemide (LASIX) 40 MG tablet Take 20 mg by mouth daily.       . hydrALAZINE (APRESOLINE) 50 MG tablet Prn for SBP >150  120 tablet  6  . isosorbide mononitrate (IMDUR) 60 MG 24 hr tablet Take 1  tablet daily as directed.  45 tablet  6  . magnesium oxide (MAG-OX) 400 MG tablet TAKE 1 TABLET BY MOUTH EVERY DAY  30 tablet  3  . metoprolol (LOPRESSOR) 50 MG tablet Take 50 mg by mouth 2 (two) times daily.        . Multiple Vitamins-Minerals (MULTIVITAMIN) tablet Take 1 tablet by mouth daily.  30 tablet    . NIFEdipine (PROCARDIA XL/ADALAT-CC) 60 MG 24 hr tablet Take 60 mg by mouth daily.        Marland Kitchen  nitroGLYCERIN (NITROSTAT) 0.4 MG SL tablet Place 0.4 mg under the tongue every 5 (five) minutes as needed.        . polyethylene glycol powder (GLYCOLAX/MIRALAX) powder Take 17 g by mouth daily.       . pravastatin (PRAVACHOL) 40 MG tablet Take 40 mg by mouth daily.        . Tamsulosin HCl (FLOMAX) 0.4 MG CAPS Take 0.4 mg by mouth daily.           Review of Systems  Constitutional: Negative.   HENT: Negative.   Eyes: Negative.   Respiratory: Negative.   Cardiovascular: Positive for leg swelling.  Gastrointestinal: Negative.   Musculoskeletal: Positive for back pain, arthralgias and gait problem.    Skin: Negative.   Neurological: Negative.   Hematological: Negative.   Psychiatric/Behavioral: Negative.   All other systems reviewed and are negative.    BP 142/62  Pulse 58  Ht 5\' 8"  (1.727 m)  Wt 132 lb (59.875 kg)  BMI 20.07 kg/m2  Physical Exam  Nursing note and vitals reviewed. Constitutional: He is oriented to person, place, and time. He appears well-developed and well-nourished.  HENT:  Head: Normocephalic.  Nose: Nose normal.  Mouth/Throat: Oropharynx is clear and moist.  Eyes: Conjunctivae are normal. Pupils are equal, round, and reactive to light.  Neck: Normal range of motion. Neck supple. No JVD present. Carotid bruit is present.  Cardiovascular: Normal rate, regular rhythm, S1 normal, S2 normal and intact distal pulses.  Exam reveals no gallop and no friction rub.   Murmur heard.  Crescendo systolic murmur is present with a grade of 2/6  Pulmonary/Chest: Effort normal and breath sounds normal. No respiratory distress. He has no wheezes. He has no rales. He exhibits no tenderness.  Abdominal: Soft. Bowel sounds are normal. He exhibits no distension. There is no tenderness.  Musculoskeletal: Normal range of motion. He exhibits no edema and no tenderness.  Lymphadenopathy:    He has no cervical adenopathy.  Neurological: He is alert and oriented to person, place, and time. Coordination normal.  Skin: Skin is warm and dry. No rash noted. No erythema.  Psychiatric: He has a normal mood and affect. His behavior is normal. Judgment and thought content normal.           Assessment and Plan

## 2011-08-18 NOTE — Assessment & Plan Note (Signed)
Edema of the legs is likely multifactorial including fluid overload which is mild, venous insufficiency, possible contribution from a calcium channel blocker. We will not increase the Ca channel blocker at this time

## 2011-08-18 NOTE — Assessment & Plan Note (Signed)
History of severe coronary artery disease. No cardiac catheterization is scheduled for planned, no stress test is planned given underlying renal dysfunction and inability to use contrast. He was asymptomatic from his recent non-STEMI.

## 2011-08-18 NOTE — Assessment & Plan Note (Addendum)
Subdural hematoma has resolved on CT scan. We could use anticoagulation if needed. We'll suggest continuing aspirin 81 mg daily.

## 2011-08-18 NOTE — Patient Instructions (Signed)
Take isosorbide 60 mg twice a day Start hydralazine 50 mg three times a day. Continue the other medications. If the blood pressure continues to be elevated during the day,  Increase the isosorbide to 1 1/2 in the Am and Pm (could take this 60 mg three times a day) Please call us if you have new issues that need to be addressed before your next appt.  We will call you for a follow up Appt. In 1 months

## 2011-08-18 NOTE — Assessment & Plan Note (Signed)
He has a single kidney with underlying renal insufficiency. Will need to proceed with diuresis gently. He does have elevated right ventricular systolic pressures and does need diuresis.

## 2011-08-18 NOTE — Assessment & Plan Note (Addendum)
His blood pressure continues to be very challenging to control. My suspicion is that this is multifactorial including underlying renal disease, fluid overload. We'll continue him on light diuretic. Attempt at renal ultrasound did not clearly show renal artery stenosis. It was not well visualized. He does have one kidney. Using contrast to visualize the renal artery would not be appropriate given his renal insufficiency.  Start hydralazine 50 mg three times a day And Change the isoSorbide From 120 mg in the morning to 60 mg b.i.d. For improved blood pressure in the morning. If the blood pressure continues to be elevated during the day, Increase the isosorbide to 1 1/2 in the Am and Pm (could take this 60 mg three times a day)

## 2011-08-30 ENCOUNTER — Telehealth: Payer: Self-pay | Admitting: Cardiovascular Disease

## 2011-08-30 NOTE — Telephone Encounter (Signed)
Pt daughter calling b/c pt HR is 49. This was after exercises with weights on feet. Sitting and standing.  BP 159/75. Daughter wants to know if she should give lunch meds.

## 2011-08-30 NOTE — Telephone Encounter (Signed)
Spoke with daughter, and his HR is now 60, BP 170/76. I told her that his metoprolol is probably the cause for lower HR, but if he is not symptomatic I would not be concerned; advised if HR <60 and he is symptomatic then he can hold metoprolol, but needs to notify our office due to high BP. She also sent BP readings, averaging 150s-180s/75-85, however, daughter was afraid to give him 1 1/2 tab of Imdur bid as previously instructed, she was afraid would drop his HR. I told her that he can take the incr dose, it should not affect HR, and if Imdur 1 1/2 tab BID does not work, he can take 60mg  TID per Dr. Ethelene Hal previous instructions at visit. Pt's daughter ok with this and will send BP results next week after this med change.

## 2011-09-11 ENCOUNTER — Telehealth: Payer: Self-pay | Admitting: Cardiovascular Disease

## 2011-09-11 DIAGNOSIS — I259 Chronic ischemic heart disease, unspecified: Secondary | ICD-10-CM

## 2011-09-11 MED ORDER — ISOSORBIDE MONONITRATE ER 60 MG PO TB24: ORAL_TABLET | ORAL | Status: DC

## 2011-09-11 MED ORDER — PRAVASTATIN SODIUM 40 MG PO TABS: 40.0000 mg | ORAL_TABLET | Freq: Every day | ORAL | Status: AC

## 2011-09-11 MED ORDER — METOPROLOL TARTRATE 50 MG PO TABS: 50.0000 mg | ORAL_TABLET | Freq: Two times a day (BID) | ORAL | Status: AC

## 2011-09-11 NOTE — Telephone Encounter (Signed)
Attempted to contact pt's daughter at number below, LMOM TCB.

## 2011-09-11 NOTE — Telephone Encounter (Signed)
Spoke to daughter, she says pt's BP has been running high at SBP180s. He is now having to take Hydralazine 50mg  QID (prn dose for qid for sbp >150). He is also taking Imdur 60mg  1 1/2 tab BID. Per Dr. Windell Hummingbird instructions in last note, I advised pt try Imdur 60mg  TID as well and call me by Friday this week with BP results. Will send to Dr. Mariah Milling for any further instructions. Daughter ok with this. She also requests refills for metoprolol, Imdur, and Pravastatin, will send now.

## 2011-09-11 NOTE — Telephone Encounter (Signed)
Pt daughter called wanting to discuss BP and pt meds.

## 2011-09-12 NOTE — Telephone Encounter (Signed)
They can use hydralazine qid, Dose up to 100 mg

## 2011-09-13 MED ORDER — HYDRALAZINE HCL 50 MG PO TABS: 100.0000 mg | ORAL_TABLET | Freq: Four times a day (QID) | ORAL | Status: AC | PRN

## 2011-09-13 NOTE — Telephone Encounter (Signed)
Notified Steward Drone (daughter) to have patient increase the hydralazine to 100 mg QID with sbp > 150 per Dr. Mariah Milling.

## 2011-09-20 ENCOUNTER — Ambulatory Visit (INDEPENDENT_AMBULATORY_CARE_PROVIDER_SITE_OTHER): Payer: Medicare Other | Admitting: Cardiovascular Disease

## 2011-09-20 ENCOUNTER — Encounter: Payer: Self-pay | Admitting: Cardiovascular Disease

## 2011-09-20 ENCOUNTER — Telehealth: Payer: Self-pay | Admitting: *Deleted

## 2011-09-20 VITALS — BP 192/74 | HR 54 | Ht 65.0 in | Wt 130.5 lb

## 2011-09-20 DIAGNOSIS — I6529 Occlusion and stenosis of unspecified carotid artery: Secondary | ICD-10-CM

## 2011-09-20 DIAGNOSIS — R001 Bradycardia, unspecified: Secondary | ICD-10-CM

## 2011-09-20 DIAGNOSIS — I498 Other specified cardiac arrhythmias: Secondary | ICD-10-CM

## 2011-09-20 DIAGNOSIS — E785 Hyperlipidemia, unspecified: Secondary | ICD-10-CM

## 2011-09-20 DIAGNOSIS — I10 Essential (primary) hypertension: Secondary | ICD-10-CM

## 2011-09-20 DIAGNOSIS — I2510 Atherosclerotic heart disease of native coronary artery without angina pectoris: Secondary | ICD-10-CM

## 2011-09-20 MED ORDER — CLONIDINE HCL 0.1 MG PO TABS
0.1000 mg | ORAL_TABLET | Freq: Two times a day (BID) | ORAL | Status: AC | PRN
Start: 2011-09-20 — End: 2012-09-19

## 2011-09-20 MED ORDER — AMLODIPINE BESYLATE 10 MG PO TABS: 10.0000 mg | ORAL_TABLET | Freq: Every day | ORAL | Status: AC

## 2011-09-20 MED ORDER — CLONIDINE HCL 0.2 MG/24HR TD PTWK: 1.0000 | MEDICATED_PATCH | TRANSDERMAL | Status: AC

## 2011-09-20 NOTE — Patient Instructions (Addendum)
You are doing well. Please take the imdur 1 1/2 pills late at night.  Please hold the adalat when you are done with the script Change to amlodipine 10 mg daily at that time Please use clonidine 0.1 mg at night for high blood pressure as needed Please call us if you have new issues that need to be addressed before your next appt.  We will call you for a follow up Appt. In 2 months

## 2011-09-20 NOTE — Assessment & Plan Note (Signed)
Continue aggressive cholesterol management 

## 2011-09-20 NOTE — Assessment & Plan Note (Addendum)
Blood pressure continues to be an issue. He does not have his blood pressure measurements with him for our review. They are to be faxed to the office later. As his blood pressure is elevated in the morning per the daughter, we have suggested she give him the isosorbide one and one half tablets at 10 or 11 PM. She is concerned about his low heart rate. We will change the Adalat to amlodipine 10 mg daily. We've also provided a prescription for clonidine 0.1 mg to be taken as needed, particularly at nighttime for hypertension and systolic pressure greater than 170. If he has chronically elevated pressures, we would likely suggest increasing the clonidine patch to 0.2 mg weekly.

## 2011-09-20 NOTE — Telephone Encounter (Signed)
After reviewing BP numbers that were faxed in, Dr. Mariah Milling wants pt to incr clonidine patch to 0.1 to 0.2 (can double patch until new rx filled). Numbers on fax much higher than what daughter thought during discussion at visit. Daughter notified of this change. She will wait one week and then send in new set of BP numbers for review.

## 2011-09-20 NOTE — Assessment & Plan Note (Signed)
Currently with no symptoms of angina. No further workup at this time. Continue current medication regimen. 

## 2011-09-20 NOTE — Assessment & Plan Note (Signed)
He is currently on pravastatin. Goal LDL less than 70
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
## 2011-09-21 ENCOUNTER — Telehealth: Payer: Self-pay | Admitting: *Deleted

## 2011-09-21 NOTE — Telephone Encounter (Signed)
Daughter called stating pt's BP 179/77 at noon, then pt took Nifedipine/Imdur and Hydralazine (b/c SBP >150). BP now (3pm) 112/52, not having any symptoms, she just wanted to make sure BP isn't going to continue to drop. Spoke to TG, advised she not give Hydralazine along with other scheduled meds, wait atleast an hour to see if BP lowers with regular meds. If 150 1-2 hrs later, THEN give Hydralazine PRN dose. Pt's daughter ok with this, she also states that BP has come up to 124/58. Told her to still give his PM IMdur per DR. Gollan since he has issues with high BP in early AM. Daughter understands and will call with any other issues.

## 2011-09-21 NOTE — Telephone Encounter (Signed)
His last visit was ordered for today for PT, asking if can have verbal order to continue through next week. I left msg with him that this was ok, please advise otherwise.

## 2011-09-25 ENCOUNTER — Telehealth: Payer: Self-pay | Admitting: Cardiovascular Disease

## 2011-09-25 NOTE — Telephone Encounter (Signed)
Pt daughter states that the pt HR is low today. Wants to know what she should do? Wants to talk to the nurse about other meds also

## 2011-09-25 NOTE — Telephone Encounter (Signed)
Spoke to daughter, HR 48, pt asymptomatic, advised to continue to monitor but if no symptoms this is ok. Daughter discussed high BP, reviewed meds with her as discussed before and reinforced the use of PRN meds with BP continued to be >150 systolic. Daughter understands and will call back with problems.

## 2011-09-29 ENCOUNTER — Other Ambulatory Visit: Payer: Self-pay | Admitting: Cardiology

## 2011-11-08 ENCOUNTER — Telehealth: Payer: Self-pay | Admitting: *Deleted

## 2011-11-08 NOTE — Telephone Encounter (Signed)
Pt's daughter calling very concerned with pt's BP. He has progressing dementia, has lost 10 lbs in <2 months, is not eating well, has f/u with pcp regarding these issues. In addition, BP still uncontrolled. Last changes were: incr clonidine patch from 0.1 to 0.2. Then at [facility] 9/26 we added 1.5 tab Imdur 60mg  late PM in addition to Imdur 60 AM and NOON; changed Adalat to Amlodipine 10mg  daily, told to take Clonidine 0.1 TAB BID only AS NEEDED (daughter says he's pretty much needing every day), and he also takes Metoprolol tart 50mg  BID. Will give you his BP recordings (pages) in summary 150s-180s/60s-70s HR ~50s. Pt does have f/u with you 12/3. Please advise in the meantime, or would you like to see pt prior?

## 2011-11-09 ENCOUNTER — Inpatient Hospital Stay: Payer: Medicare Other | Admitting: Specialist

## 2011-11-10 DIAGNOSIS — R072 Precordial pain: Secondary | ICD-10-CM

## 2011-11-22 ENCOUNTER — Ambulatory Visit: Payer: Medicare Other | Admitting: Cardiovascular Disease

## 2011-11-25 ENCOUNTER — Ambulatory Visit: Payer: Medicare Other | Admitting: Internal Medicine

## 2011-11-27 ENCOUNTER — Ambulatory Visit: Payer: Medicare Other | Admitting: Cardiovascular Disease

## 2011-11-28 ENCOUNTER — Other Ambulatory Visit: Payer: Self-pay | Admitting: Cardiovascular Disease

## 2011-11-30 ENCOUNTER — Ambulatory Visit: Payer: Medicare Other | Admitting: Cardiovascular Disease

## 2011-12-17 ENCOUNTER — Inpatient Hospital Stay: Payer: Medicare Other | Admitting: Internal Medicine

## 2011-12-23 ENCOUNTER — Encounter: Payer: Medicare Other | Admitting: Internal Medicine

## 2011-12-26 ENCOUNTER — Encounter: Payer: Self-pay | Admitting: Internal Medicine

## 2011-12-26 ENCOUNTER — Ambulatory Visit: Payer: Self-pay | Admitting: Internal Medicine

## 2012-01-26 ENCOUNTER — Encounter: Payer: Self-pay | Admitting: Internal Medicine

## 2012-01-26 DEATH — deceased

## 2012-10-29 ENCOUNTER — Encounter: Payer: Self-pay | Admitting: Vascular Surgery

## 2015-04-18 NOTE — Discharge Summary (Signed)
PATIENT NAME:  [name], [name] MR#:  [id number] DATE OF BIRTH:  [birth year]  DATE OF ADMISSION:  12/17/2011 DATE OF DISCHARGE:  12/22/2011  PRIMARY CARE PHYSICIAN: Barry Brunner, MD  DISPOSITION: The patient will be discharged home with hospice.   FINAL DIAGNOSES:  1. Acute respiratory failure, bronchitis, and pneumonia (aspiration versus hospital-acquired).  2. Acute renal failure.  3. Chest pain.  4. Weakness.  5. Clavicle and rib fracture.  6. Coronary artery disease and elevated cardiac enzymes.  7. Hypertension.  8. Hyperlipidemia.  9. Benign prostatic hypertrophy.  10. Dementia.  11. Anemia.  12. Constipation.   DISCHARGE MEDICATIONS:  1. Flomax 0.4 mg daily.  2. Lexapro 10 mg daily.  3. Multivitamin daily.  4. Nexium 40 mg daily.  5. Colace 100 mg daily.  6. Metoprolol 50 mg twice a day. 7. Aspirin 81 mg daily.  8. Nitrostat p.r.n. every 5 minutes for chest pain. 9. MiraLax 17 grams daily.  10. Clonidine patch 0.2 mg/24 hours extended-release once a week. 11. Imdur 60 mg daily.  12. Augmentin 500 mg twice a day for one week. 13. Prednisone taper as written on prescription. 14. DuoNeb nebulizer solution 1 unit every six hours as needed for shortness of breath. 15. Roxanol 5 mg every one hour as needed for pain or shortness of breath.   NOTE: Do not take Lasix, do not take Norvasc, do not take hydralazine, do not take Pravachol, and do not take nifedipine.  OXYGEN: 5 liters nasal cannula.   DIET: Low sodium diet, puree with thickened liquids.   ACTIVITY: As tolerated.   REFERRALS: Home with hospice.   REASON FOR ADMISSION: The patient was admitted 12/17/2011 and discharged home with hospice care 12/22/2011. The patient was admitted with recurrent falls, right clavicular and rib fracture.  HISTORY/HOSPITAL COURSE: This is an 81 year old man who had two falls. He uses a walker to get around the house, tripped over his walker and fell. He did not lose  consciousness. He was found to have a borderline troponin and hospitalist services were contacted for further evaluation. He was admitted to the hospital with acute renal failure, given IV fluid hydration, and elevated troponin. Serial cardiac enzymes were ordered. He was continued on his aspirin, beta blocker, and statin.  1. For his recurrent fall, physical therapy consultation was ordered.  2. For his right clavicular fracture and rib fracture, he was given pain control. The patient progressed and worsened during the hospital stay first with a bronchitis and then looks like a pneumonia. He was started on Rocephin and clarithromycin and then antibiotics were changed to Zosyn and Zyvox. The patient continued to decline with altered mental status. The patient was made a DO NOT RESUSCITATE by palliative care. Hospice came to evaluate. The family would like to take the patient home to pass away at home. The decision was made to send the patient home.  3. For his acute respiratory failure, bronchitis, and pneumonia, since the decision was made to send him home, he will be sent home on Augmentin and a prednisone taper, DuoNeb nebulizer solution, and oxygen 5 liters nasal cannula set up through hospice.  4. For his acute renal failure, his creatinine continued to worsen on a day-to-day basis despite IV fluid hydration. Creatinine upon discharge was 4.46. The patient was not a candidate for dialysis with his age, dementia, and multiple comorbidities. The patient did have chest pain during the hospital stay which was relieved with nitroglycerin.  5. For his weakness,  he did have a clavicle and rib fracture. Physical therapy was consulted but was not able to do much with him.  6. For his clavicle and rib fracture, pain control was given.  7. For his coronary artery disease and elevated cardiac enzymes on presentation, it may have been secondary to the renal failure.  8. For his hypertension, blood pressure was  elevated when he came in and started getting lower during the hospital course. Medications needed to be held and will be held upon discharge.  9. For his hyperlipidemia, medications will be held upon discharge.  10. For his benign prostatic hypertrophy, difficulty getting urine out, Foley catheter was placed and will be kept for comfort with hospice.  11. For his dementia, the patient never complained of any issues.  12. For his anemia, ferrous sulfate was given but that was held upon discharge.  13. For his constipation, he did have a bowel movement with lactulose.   LABS/STUDIES: ABG: pH 7.41, pCO2 44, pO2 275, and oxygen saturation 8. That must have been a venous.  EKG showed a normal sinus rhythm, left bundle branch block.   Urinalysis was negative.   Troponin was 0.07 and INR 1.1. White blood cell count 10.6, hemoglobin and hematocrit 10.4 and 31.7, and platelet count 188. Glucose 125, BUN 42, creatinine 2.31, sodium 132, potassium 4.7, chloride 95, CO2 28, and calcium 8.7.   MRI showed no evidence of acute fracture. Degenerative disks at multiple cervical levels.  CT of the head showed no acute intracranial hemorrhage.   CT of the chest, abdomen and pelvis showed an acute fracture of the right clavicle, wedge compression at T12, and rib fracture of right fifth rib.  Glucose upon discharge 114, BUN 96, creatinine 4.46, sodium 135, potassium 4.9, chloride 101, CO2 22, and calcium 7.5. White blood cell count 8.7, hemoglobin and hematocrit 9.1 and 27.7, and platelet count 139.       PROGNOSIS: His overall prognosis is poor. The patient was sent home with hospice care.  TIME SPENT ON DISCHARGE: 40 minutes.  ____________________________ Herschell Dimesichard J. Renae GlossWieting, MD [id number] D: 12/22/2011 18:04:25 ET T: 12/25/2011 15:07:37 ET JOB#: [id number]  cc: Herschell Dimesichard J. Renae GlossWieting, MD, <Dictator> Jorje GuildGlenn R. Beckey DowningWillett, MD Salley ScarletICHARD J Clay Menser MD ELECTRONICALLY SIGNED 01/10/2012 16:02
